# Patient Record
Sex: Male | Born: 1950
Health system: Southern US, Community
[De-identification: ages and names within clinical notes are randomized; demographics above are authoritative.]

## PROBLEM LIST (undated history)

## (undated) DIAGNOSIS — M5030 Other cervical disc degeneration, unspecified cervical region: Secondary | ICD-10-CM

## (undated) DIAGNOSIS — N529 Male erectile dysfunction, unspecified: Secondary | ICD-10-CM

## (undated) DIAGNOSIS — Z87442 Personal history of urinary calculi: Secondary | ICD-10-CM

## (undated) DIAGNOSIS — J189 Pneumonia, unspecified organism: Secondary | ICD-10-CM

## (undated) DIAGNOSIS — N401 Enlarged prostate with lower urinary tract symptoms: Secondary | ICD-10-CM

## (undated) DIAGNOSIS — M1990 Unspecified osteoarthritis, unspecified site: Secondary | ICD-10-CM

## (undated) DIAGNOSIS — G4730 Sleep apnea, unspecified: Secondary | ICD-10-CM

## (undated) DIAGNOSIS — G4733 Obstructive sleep apnea (adult) (pediatric): Secondary | ICD-10-CM

## (undated) DIAGNOSIS — K5190 Ulcerative colitis, unspecified, without complications: Secondary | ICD-10-CM

## (undated) DIAGNOSIS — R3912 Poor urinary stream: Secondary | ICD-10-CM

## (undated) DIAGNOSIS — E782 Mixed hyperlipidemia: Secondary | ICD-10-CM

## (undated) DIAGNOSIS — N189 Chronic kidney disease, unspecified: Secondary | ICD-10-CM

## (undated) DIAGNOSIS — R7301 Impaired fasting glucose: Secondary | ICD-10-CM

## (undated) DIAGNOSIS — R7303 Prediabetes: Secondary | ICD-10-CM

## (undated) DIAGNOSIS — I499 Cardiac arrhythmia, unspecified: Secondary | ICD-10-CM

## (undated) DIAGNOSIS — N486 Induration penis plastica: Secondary | ICD-10-CM

## (undated) DIAGNOSIS — M1712 Unilateral primary osteoarthritis, left knee: Secondary | ICD-10-CM

## (undated) HISTORY — DX: Unilateral primary osteoarthritis, left knee: M17.12

## (undated) HISTORY — DX: Impaired fasting glucose: R73.01

## (undated) HISTORY — DX: Obstructive sleep apnea (adult) (pediatric): G47.33

## (undated) HISTORY — PX: APPENDECTOMY: SHX54

## (undated) HISTORY — PX: OTHER SURGICAL HISTORY: SHX169

## (undated) HISTORY — DX: Sleep apnea, unspecified: G47.30

## (undated) HISTORY — PX: TONSILLECTOMY: SUR1361

## (undated) HISTORY — PX: KNEE ARTHROSCOPY: SUR90

## (undated) HISTORY — DX: Induration penis plastica: N48.6

## (undated) HISTORY — DX: Mixed hyperlipidemia: E78.2

## (undated) HISTORY — PX: ILEOSTOMY: SHX1783

---

## 1989-09-06 DIAGNOSIS — Z932 Ileostomy status: Secondary | ICD-10-CM

## 1989-09-06 HISTORY — PX: COLOPROCTECTOMY W/ ILEO J POUCH: SUR277

## 1989-09-06 HISTORY — DX: Ileostomy status: Z93.2

## 2000-09-06 HISTORY — PX: INGUINAL HERNIA REPAIR: SUR1180

## 2000-09-06 HISTORY — PX: HERNIA REPAIR: SHX51

## 2005-09-23 ENCOUNTER — Ambulatory Visit: Payer: Self-pay | Admitting: Internal Medicine

## 2005-10-22 ENCOUNTER — Ambulatory Visit: Payer: Self-pay | Admitting: Internal Medicine

## 2008-03-15 DIAGNOSIS — G4733 Obstructive sleep apnea (adult) (pediatric): Secondary | ICD-10-CM | POA: Insufficient documentation

## 2008-03-15 DIAGNOSIS — K5190 Ulcerative colitis, unspecified, without complications: Secondary | ICD-10-CM

## 2008-03-15 DIAGNOSIS — G4730 Sleep apnea, unspecified: Secondary | ICD-10-CM

## 2008-03-15 HISTORY — DX: Ulcerative colitis, unspecified, without complications: K51.90

## 2008-03-18 ENCOUNTER — Ambulatory Visit: Payer: Self-pay | Admitting: Internal Medicine

## 2008-04-18 ENCOUNTER — Encounter: Payer: Self-pay | Admitting: Internal Medicine

## 2008-09-27 HISTORY — PX: REPLACEMENT UNICONDYLAR JOINT KNEE: SUR1227

## 2010-01-13 ENCOUNTER — Ambulatory Visit: Payer: Self-pay | Admitting: Internal Medicine

## 2010-10-08 NOTE — Assessment & Plan Note (Signed)
Summary: rov/ mbw   Primary Provider/Referring Provider:  Elray Mcgregor, MD  CC:  follow up visit-CPAP machine troubles..  History of Present Illness: COLITIS, ULCERATIVE (ICD-556.9) SLEEP APNEA (ICD-780.57) 03/18/08- 60 year old man returning for follow-up of sleep apnea, and complaining of insomnia.  Blames mucus in throat for difficulty falling asleep and staying asleep.  Not using the humidifier on his CPAP.  Takes Mucinex at bedtime, and adds a Breathe Right strip.  He remains on chronic auto titration CPAP and is satisfied with this.  His ostomy appliance keeps him from sleeping on his side.  His original sleep study on 12/30/93 gave an index of 36/hr.  Now he says chronic knee pain sometimes bothers him at night.  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  We discussed sleep hygiene, realistic expectations, and long-term management of his CPAP.  Jan 13, 2010- Insomnia/ OSA Slept poorly last night saying CPAP machine stopped working. Just in last 2 days has been sleepier in daytime. He says he has been averaging  5 hours of sleep, midnight to 5AM. He expresses economic concern about various world issues. He drinks coffee, 1-2 glasses of wine at night with wife. He has changed diet, reducing carbs and asks if this explains less need for sleep. We discussed sleep hygiene.         Preventive Screening-Counseling & Management  Alcohol-Tobacco     Smoking Status: never  Current Medications (verified): 1)  Adult Aspirin Ec Low Strength 81 Mg  Tbec (Aspirin) .... Take  4  Tablet Daily. 2)  Cpap  - Auto .... At Bedtime 3)  Cvs Natural Fish Oil 1000 Mg  Caps (Omega-3 Fatty Acids) .... Take One Tablet Daily. 4)  Mucinex 600 Mg  Tb12 (Guaifenesin) .... Take 1 By Mouth At Bedtime 5)  Co Q 10 10 Mg  Caps (Coenzyme Q10) .... 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  Smoking Status:  never  Review of Systems      See HPI  The patient denies anorexia, fever, weight loss, weight gain, vision loss, decreased hearing, hoarseness, chest pain, syncope, dyspnea on exertion, peripheral edema, prolonged cough, headaches, hemoptysis, abdominal pain, and severe indigestion/heartburn.    Vital Signs:  Patient profile:   60 year old male Weight:      204 pounds O2 Sat:      95 % on Room air Pulse rate:   90 / minute BP sitting:   144 / 98  (right arm) Cuff size:   regular  Vitals Entered By: Reynaldo Minium CMA (Jan 13, 2010 3:45 PM)  O2 Flow:  Room air  Physical Exam  Additional Exam:  GENERAL:  A/Ox3; pleasant & cooperative.NAD, warm/ light diaphoresis, talkative HEENT:  [place]/AT, EOM-wnl, PERRLA, EACs-clear, TMs-wnl, NOSE-clear, THROAT-clear & wnl, Mallampati  II NECK:  Supple w/ fair ROM; no JVD; normal carotid impulses w/o bruits; no thyromegaly or nodules palpated; no lymphadenopathy. CHEST: Clear to P&A HEART:  RRR, no m/r/g  heard ABDOMEN:  EXT: Warm bilat,  no calf pain, edema, clubbing, pulses intact Skin: no rash/lesion     Impression & Recommendations:  Problem # 1:  SLEEP APNEA (ICD-780.57)  Talkative energetic person- says thyroid ok. Insomnia doesn't seem to bother him. We will provide script to replace worn out autotitration CPAP machine.  Medications Added to Medication List This Visit: 1)  Adult Aspirin Ec Low Strength 81 Mg Tbec (Aspirin) .... Take  4  tablet  daily. 2)  Cpap - Auto Advanced  .... At bedtime 3)  Replacement Cpap Machine Autotitration  .... 5-15 cwp, humidifier  Other Orders: Est. Patient Level III (16109)  Patient Instructions: 1)  Please schedule a follow-up appointment in 1 year. 2)  Script for replacement CPAP machine - continue autotitration. Prescriptions: REPLACEMENT CPAP MACHINE AUTOTITRATION 5-15 cwp, humidifier  #1 x prn   Entered and Authorized by:   Waymon Budge MD   Signed by:   Waymon Budge MD on 01/13/2010   Method used:   Print then Give to Patient   RxID:   [id number]

## 2011-09-09 DIAGNOSIS — Z96652 Presence of left artificial knee joint: Secondary | ICD-10-CM | POA: Insufficient documentation

## 2011-09-09 DIAGNOSIS — Z471 Aftercare following joint replacement surgery: Secondary | ICD-10-CM | POA: Insufficient documentation

## 2016-10-18 DIAGNOSIS — M2041 Other hammer toe(s) (acquired), right foot: Secondary | ICD-10-CM | POA: Insufficient documentation

## 2016-10-18 DIAGNOSIS — L600 Ingrowing nail: Secondary | ICD-10-CM | POA: Insufficient documentation

## 2016-10-18 DIAGNOSIS — M205X2 Other deformities of toe(s) (acquired), left foot: Secondary | ICD-10-CM | POA: Insufficient documentation

## 2017-09-06 DIAGNOSIS — N486 Induration penis plastica: Secondary | ICD-10-CM

## 2017-09-06 HISTORY — DX: Induration penis plastica: N48.6

## 2017-12-21 DIAGNOSIS — M2022 Hallux rigidus, left foot: Secondary | ICD-10-CM | POA: Insufficient documentation

## 2018-04-24 DIAGNOSIS — M4722 Other spondylosis with radiculopathy, cervical region: Secondary | ICD-10-CM | POA: Insufficient documentation

## 2018-06-07 DIAGNOSIS — M79671 Pain in right foot: Secondary | ICD-10-CM | POA: Insufficient documentation

## 2018-08-07 DIAGNOSIS — M4722 Other spondylosis with radiculopathy, cervical region: Secondary | ICD-10-CM | POA: Diagnosis not present

## 2018-08-10 DIAGNOSIS — M4722 Other spondylosis with radiculopathy, cervical region: Secondary | ICD-10-CM | POA: Diagnosis not present

## 2018-08-11 DIAGNOSIS — H2513 Age-related nuclear cataract, bilateral: Secondary | ICD-10-CM | POA: Diagnosis not present

## 2018-08-11 DIAGNOSIS — H21233 Degeneration of iris (pigmentary), bilateral: Secondary | ICD-10-CM | POA: Diagnosis not present

## 2018-09-06 HISTORY — PX: CATARACT EXTRACTION W/ INTRAOCULAR LENS IMPLANT: SHX1309

## 2018-09-13 DIAGNOSIS — H2512 Age-related nuclear cataract, left eye: Secondary | ICD-10-CM | POA: Diagnosis not present

## 2018-09-13 DIAGNOSIS — E119 Type 2 diabetes mellitus without complications: Secondary | ICD-10-CM | POA: Diagnosis not present

## 2018-09-13 DIAGNOSIS — H25812 Combined forms of age-related cataract, left eye: Secondary | ICD-10-CM | POA: Diagnosis not present

## 2018-09-13 DIAGNOSIS — Z01818 Encounter for other preprocedural examination: Secondary | ICD-10-CM | POA: Diagnosis not present

## 2018-09-13 DIAGNOSIS — H52222 Regular astigmatism, left eye: Secondary | ICD-10-CM | POA: Diagnosis not present

## 2018-10-18 DIAGNOSIS — H25811 Combined forms of age-related cataract, right eye: Secondary | ICD-10-CM | POA: Diagnosis not present

## 2018-10-18 DIAGNOSIS — H2511 Age-related nuclear cataract, right eye: Secondary | ICD-10-CM | POA: Diagnosis not present

## 2018-10-18 DIAGNOSIS — H52221 Regular astigmatism, right eye: Secondary | ICD-10-CM | POA: Diagnosis not present

## 2019-01-02 DIAGNOSIS — L237 Allergic contact dermatitis due to plants, except food: Secondary | ICD-10-CM | POA: Diagnosis not present

## 2019-01-02 DIAGNOSIS — R972 Elevated prostate specific antigen [PSA]: Secondary | ICD-10-CM | POA: Diagnosis not present

## 2019-01-02 DIAGNOSIS — N486 Induration penis plastica: Secondary | ICD-10-CM | POA: Diagnosis not present

## 2019-01-18 DIAGNOSIS — R7301 Impaired fasting glucose: Secondary | ICD-10-CM | POA: Diagnosis not present

## 2019-01-18 DIAGNOSIS — E782 Mixed hyperlipidemia: Secondary | ICD-10-CM | POA: Diagnosis not present

## 2019-01-18 DIAGNOSIS — G4733 Obstructive sleep apnea (adult) (pediatric): Secondary | ICD-10-CM | POA: Diagnosis not present

## 2019-01-18 DIAGNOSIS — Z6827 Body mass index (BMI) 27.0-27.9, adult: Secondary | ICD-10-CM | POA: Diagnosis not present

## 2019-01-19 DIAGNOSIS — E782 Mixed hyperlipidemia: Secondary | ICD-10-CM | POA: Diagnosis not present

## 2019-01-19 DIAGNOSIS — R7301 Impaired fasting glucose: Secondary | ICD-10-CM | POA: Diagnosis not present

## 2019-01-23 DIAGNOSIS — N486 Induration penis plastica: Secondary | ICD-10-CM | POA: Diagnosis not present

## 2019-01-30 DIAGNOSIS — N486 Induration penis plastica: Secondary | ICD-10-CM | POA: Diagnosis not present

## 2019-01-31 DIAGNOSIS — N486 Induration penis plastica: Secondary | ICD-10-CM | POA: Diagnosis not present

## 2019-03-13 DIAGNOSIS — N486 Induration penis plastica: Secondary | ICD-10-CM | POA: Diagnosis not present

## 2019-03-29 DIAGNOSIS — N486 Induration penis plastica: Secondary | ICD-10-CM | POA: Diagnosis not present

## 2019-04-02 DIAGNOSIS — N486 Induration penis plastica: Secondary | ICD-10-CM | POA: Diagnosis not present

## 2019-04-27 DIAGNOSIS — N486 Induration penis plastica: Secondary | ICD-10-CM | POA: Diagnosis not present

## 2019-05-22 DIAGNOSIS — N486 Induration penis plastica: Secondary | ICD-10-CM | POA: Diagnosis not present

## 2019-05-24 DIAGNOSIS — N486 Induration penis plastica: Secondary | ICD-10-CM | POA: Diagnosis not present

## 2019-06-28 DIAGNOSIS — Z01812 Encounter for preprocedural laboratory examination: Secondary | ICD-10-CM | POA: Diagnosis not present

## 2019-06-28 DIAGNOSIS — Z23 Encounter for immunization: Secondary | ICD-10-CM | POA: Diagnosis not present

## 2019-08-07 DIAGNOSIS — Z20828 Contact with and (suspected) exposure to other viral communicable diseases: Secondary | ICD-10-CM | POA: Diagnosis not present

## 2019-08-15 DIAGNOSIS — M1712 Unilateral primary osteoarthritis, left knee: Secondary | ICD-10-CM | POA: Diagnosis not present

## 2019-08-15 DIAGNOSIS — M25562 Pain in left knee: Secondary | ICD-10-CM | POA: Diagnosis not present

## 2019-08-15 DIAGNOSIS — Z20828 Contact with and (suspected) exposure to other viral communicable diseases: Secondary | ICD-10-CM | POA: Diagnosis not present

## 2019-08-15 DIAGNOSIS — Z888 Allergy status to other drugs, medicaments and biological substances status: Secondary | ICD-10-CM | POA: Diagnosis not present

## 2019-08-15 DIAGNOSIS — R519 Headache, unspecified: Secondary | ICD-10-CM | POA: Diagnosis not present

## 2019-08-15 DIAGNOSIS — Z471 Aftercare following joint replacement surgery: Secondary | ICD-10-CM | POA: Diagnosis not present

## 2019-08-15 DIAGNOSIS — M791 Myalgia, unspecified site: Secondary | ICD-10-CM | POA: Diagnosis not present

## 2019-08-15 DIAGNOSIS — Z96652 Presence of left artificial knee joint: Secondary | ICD-10-CM | POA: Diagnosis not present

## 2019-08-16 DIAGNOSIS — M1712 Unilateral primary osteoarthritis, left knee: Secondary | ICD-10-CM | POA: Diagnosis not present

## 2019-08-16 DIAGNOSIS — J069 Acute upper respiratory infection, unspecified: Secondary | ICD-10-CM | POA: Diagnosis not present

## 2019-08-16 DIAGNOSIS — N486 Induration penis plastica: Secondary | ICD-10-CM | POA: Diagnosis not present

## 2019-08-22 DIAGNOSIS — H26493 Other secondary cataract, bilateral: Secondary | ICD-10-CM | POA: Diagnosis not present

## 2019-08-24 DIAGNOSIS — N486 Induration penis plastica: Secondary | ICD-10-CM | POA: Diagnosis not present

## 2019-10-29 DIAGNOSIS — H26491 Other secondary cataract, right eye: Secondary | ICD-10-CM | POA: Diagnosis not present

## 2019-11-06 DIAGNOSIS — N486 Induration penis plastica: Secondary | ICD-10-CM | POA: Diagnosis not present

## 2019-11-06 DIAGNOSIS — R3912 Poor urinary stream: Secondary | ICD-10-CM | POA: Diagnosis not present

## 2019-11-06 DIAGNOSIS — N401 Enlarged prostate with lower urinary tract symptoms: Secondary | ICD-10-CM | POA: Diagnosis not present

## 2019-11-07 DIAGNOSIS — N486 Induration penis plastica: Secondary | ICD-10-CM | POA: Diagnosis not present

## 2019-11-14 DIAGNOSIS — H26492 Other secondary cataract, left eye: Secondary | ICD-10-CM | POA: Diagnosis not present

## 2019-12-07 ENCOUNTER — Other Ambulatory Visit: Payer: Self-pay | Admitting: Family Medicine

## 2019-12-17 DIAGNOSIS — D225 Melanocytic nevi of trunk: Secondary | ICD-10-CM | POA: Diagnosis not present

## 2019-12-17 DIAGNOSIS — L814 Other melanin hyperpigmentation: Secondary | ICD-10-CM | POA: Diagnosis not present

## 2019-12-17 DIAGNOSIS — D2361 Other benign neoplasm of skin of right upper limb, including shoulder: Secondary | ICD-10-CM | POA: Diagnosis not present

## 2019-12-17 DIAGNOSIS — L821 Other seborrheic keratosis: Secondary | ICD-10-CM | POA: Diagnosis not present

## 2019-12-25 DIAGNOSIS — E785 Hyperlipidemia, unspecified: Secondary | ICD-10-CM | POA: Insufficient documentation

## 2019-12-25 DIAGNOSIS — R7301 Impaired fasting glucose: Secondary | ICD-10-CM | POA: Insufficient documentation

## 2019-12-25 DIAGNOSIS — R030 Elevated blood-pressure reading, without diagnosis of hypertension: Secondary | ICD-10-CM | POA: Insufficient documentation

## 2019-12-25 NOTE — Progress Notes (Signed)
Established Patient Office Visit  Subjective:  Patient ID: Tyler Giles, male    DOB: 08-Jul-1951  Age: 69 y.o. MRN: [id number]  CC:  Chief Complaint  Patient presents with  . Diabetes  . Hyperlipidemia   HPI Tyler Giles presents with hyperlipidemia.  Current treatment includes Crestor.  Compliance with treatment has been good; he takes his medication as directed, maintains his low cholesterol diet, follows up as directed, and maintains his exercise regimen.      Tyler Giles presents with a diagnosis of impaired fasting glucose.  This was diagnosed years ago. Patient is eating very healthy and exercising. Patient is taking metformin.      Follow up of obstructive sleep apnea (adult) (pediatric).  Wears Cpap regularly. Patient states he sleeps better with cpap.  Ulcerative colitis in the past. Has a colostomy.     Past Medical History:  Diagnosis Date  . Impaired fasting glucose   . Induration penis plastica   . Mixed hyperlipidemia   . Obstructive sleep apnea   . Unilateral primary osteoarthritis, left knee     Past Surgical History:  Procedure Laterality Date  . APPENDECTOMY    . HERNIA REPAIR  2002   x2  . ILEOSTOMY    . Left knee arthroscopy    . Left partial Knee replacement      Family History  Problem Relation Age of Onset  . Cerebrovascular Accident Mother   . Leukemia Father   . Coronary artery disease Maternal Uncle   . Coronary artery disease Maternal Grandmother     Social History   Socioeconomic History  . Marital status: Married    Spouse name: Not on file  . Number of children: 2  . Years of education: Not on file  . Highest education level: Not on file  Occupational History  . Not on file  Tobacco Use  . Smoking status: Never Smoker  . Smokeless tobacco: Never Used  Substance and Sexual Activity  . Alcohol use: Not on file    Comment: Patient drinks socially and typically consumes wine.  . Drug use: Never  . Sexual activity: Not on  file  Other Topics Concern  . Not on file  Social History Narrative  . Not on file   Social Determinants of Health   Financial Resource Strain:   . Difficulty of Paying Living Expenses:   Food Insecurity:   . Worried About Programme researcher, broadcasting/film/video in the Last Year:   . Barista in the Last Year:   Transportation Needs:   . Freight forwarder (Medical):   Marland Kitchen Lack of Transportation (Non-Medical):   Physical Activity:   . Days of Exercise per Week:   . Minutes of Exercise per Session:   Stress:   . Feeling of Stress :   Social Connections:   . Frequency of Communication with Friends and Family:   . Frequency of Social Gatherings with Friends and Family:   . Attends Religious Services:   . Active Member of Clubs or Organizations:   . Attends Banker Meetings:   Marland Kitchen Marital Status:   Intimate Partner Violence:   . Fear of Current or Ex-Partner:   . Emotionally Abused:   Marland Kitchen Physically Abused:   . Sexually Abused:     Outpatient Medications Prior to Visit  Medication Sig Dispense Refill  . aspirin 81 MG EC tablet Take by mouth.    . Cholecalciferol 100 MCG (4000 UT) CAPS Take  by mouth.    . Coenzyme Q10 (COQ10) 150 MG CAPS Take by mouth.    . finasteride (PROSCAR) 5 MG tablet TAKE 1 TABLET BY MOUTH ONCE DAILY 90 tablet 1  . metFORMIN (GLUCOPHAGE) 500 MG tablet Take by mouth.    . Multiple Vitamin (MULTIVITAMIN) capsule Take by mouth.    . rosuvastatin (CRESTOR) 5 MG tablet TAKE 1 TABLET BY MOUTH ONCE DAILY 90 tablet 1  . tamsulosin (FLOMAX) 0.4 MG CAPS capsule Take 0.4 mg by mouth daily.    Marland Kitchen zinc gluconate 50 MG tablet Take by mouth.     No facility-administered medications prior to visit.    Allergies  Allergen Reactions  . Atorvastatin Other (See Comments)    Muscle cramps    ROS Review of Systems  Constitutional: Negative for chills, fatigue and fever.  HENT: Negative for congestion, ear pain and sore throat.   Respiratory: Negative for cough  and shortness of breath.   Cardiovascular: Negative for chest pain and leg swelling.  Gastrointestinal: Negative for abdominal pain, constipation, diarrhea, nausea and vomiting.  Genitourinary: Negative for dysuria and frequency.  Musculoskeletal: Positive for neck pain. Negative for arthralgias, back pain and myalgias.       Monitoring. Occasionally aleve.   Neurological: Negative for dizziness and headaches.  Psychiatric/Behavioral: Negative for dysphoric mood. The patient is not nervous/anxious.       Objective:    Physical Exam  Constitutional: He is oriented to person, place, and time. He appears well-developed and well-nourished.  Cardiovascular: Normal rate, regular rhythm and normal heart sounds.  Pulmonary/Chest: Effort normal and breath sounds normal. No respiratory distress.  Abdominal: Soft. Bowel sounds are normal. There is no abdominal tenderness.  Musculoskeletal:        General: No edema.  Neurological: He is alert and oriented to person, place, and time.  Psychiatric: He has a normal mood and affect. His behavior is normal.   BP (!) 134/92   Pulse 74   Temp (!) 97.4 F (36.3 C)   Ht 5\' 9"  (1.753 m)   Wt 193 lb (87.5 kg)   SpO2 100%   BMI 28.50 kg/m  Wt Readings from Last 3 Encounters:  12/26/19 193 lb (87.5 kg)   Health Maintenance Due  Topic Date Due  . Hepatitis C Screening  Never done  . COVID-19 Vaccine (1) Never done  . COLONOSCOPY  Never done     Assessment & Plan:  1. Mixed hyperlipidemia Well controlled.  No changes to medicines.  Continue to work on eating a healthy diet and exercise.  Labs drawn today.  - CBC - Comprehensive metabolic panel - Lipid panel  2. Impaired fasting glucose Well controlled.  No changes to medicines.  Continue to work on eating a healthy diet and exercise.  Labs drawn today.  - Hemoglobin A1c  3. Colostomy status - no problems  4. Cervical DDD with radiculopathy. Managing now. Monitor. Sees Teodora Medici,  NP.   5. Patient would like to get the bcg vaccine because he has researched it and some research shows it may protect from covid 19. We will work on this.   Follow-up: Return in about 6 months (around 06/26/2020) for AWV.    Rochel Brome, MD

## 2019-12-26 ENCOUNTER — Other Ambulatory Visit: Payer: Self-pay

## 2019-12-26 ENCOUNTER — Encounter: Payer: Self-pay | Admitting: Family Medicine

## 2019-12-26 ENCOUNTER — Ambulatory Visit (INDEPENDENT_AMBULATORY_CARE_PROVIDER_SITE_OTHER): Payer: BC Managed Care – PPO | Admitting: Family Medicine

## 2019-12-26 VITALS — BP 134/92 | HR 74 | Temp 97.4°F | Ht 69.0 in | Wt 193.0 lb

## 2019-12-26 DIAGNOSIS — E782 Mixed hyperlipidemia: Secondary | ICD-10-CM

## 2019-12-26 DIAGNOSIS — R7301 Impaired fasting glucose: Secondary | ICD-10-CM

## 2019-12-26 DIAGNOSIS — Z933 Colostomy status: Secondary | ICD-10-CM | POA: Diagnosis not present

## 2019-12-26 DIAGNOSIS — M4722 Other spondylosis with radiculopathy, cervical region: Secondary | ICD-10-CM | POA: Diagnosis not present

## 2019-12-26 MED ORDER — METFORMIN HCL ER 500 MG PO TB24: 500.0000 mg | ORAL_TABLET | Freq: Two times a day (BID) | ORAL | 3 refills | Status: DC

## 2019-12-27 LAB — CBC
Hematocrit: 44.2 % (ref 37.5–51.0)
Hemoglobin: 15.2 g/dL (ref 13.0–17.7)
MCH: 31.7 pg (ref 26.6–33.0)
MCHC: 34.4 g/dL (ref 31.5–35.7)
MCV: 92 fL (ref 79–97)
Platelets: 191 10*3/uL (ref 150–450)
RBC: 4.79 x10E6/uL (ref 4.14–5.80)
RDW: 13.1 % (ref 11.6–15.4)
WBC: 4.4 10*3/uL (ref 3.4–10.8)

## 2019-12-27 LAB — LIPID PANEL
Chol/HDL Ratio: 2.7 ratio (ref 0.0–5.0)
Cholesterol, Total: 159 mg/dL (ref 100–199)
HDL: 58 mg/dL (ref >39)
LDL Chol Calc (NIH): 84 mg/dL (ref 0–99)
Triglycerides: 92 mg/dL (ref 0–149)
VLDL Cholesterol Cal: 17 mg/dL (ref 5–40)

## 2019-12-27 LAB — COMPREHENSIVE METABOLIC PANEL
ALT: 15 IU/L (ref 0–44)
AST: 22 IU/L (ref 0–40)
Albumin/Globulin Ratio: 2.6 — ABNORMAL HIGH (ref 1.2–2.2)
Albumin: 4.6 g/dL (ref 3.8–4.8)
Alkaline Phosphatase: 72 IU/L (ref 39–117)
BUN/Creatinine Ratio: 21 (ref 10–24)
BUN: 20 mg/dL (ref 8–27)
Bilirubin Total: 0.5 mg/dL (ref 0.0–1.2)
CO2: 25 mmol/L (ref 20–29)
Calcium: 9.8 mg/dL (ref 8.6–10.2)
Chloride: 103 mmol/L (ref 96–106)
Creatinine, Ser: 0.95 mg/dL (ref 0.76–1.27)
GFR calc Af Amer: 95 mL/min/{1.73_m2} (ref >59)
GFR calc non Af Amer: 82 mL/min/{1.73_m2} (ref >59)
Globulin, Total: 1.8 g/dL (ref 1.5–4.5)
Glucose: 95 mg/dL (ref 65–99)
Potassium: 5.1 mmol/L (ref 3.5–5.2)
Sodium: 141 mmol/L (ref 134–144)
Total Protein: 6.4 g/dL (ref 6.0–8.5)

## 2019-12-27 LAB — HEMOGLOBIN A1C
Est. average glucose Bld gHb Est-mCnc: 108 mg/dL
Hgb A1c MFr Bld: 5.4 % (ref 4.8–5.6)

## 2019-12-27 LAB — CARDIOVASCULAR RISK ASSESSMENT

## 2020-01-06 ENCOUNTER — Encounter: Payer: Self-pay | Admitting: Family Medicine

## 2020-01-18 ENCOUNTER — Other Ambulatory Visit: Payer: Self-pay

## 2020-02-19 ENCOUNTER — Ambulatory Visit: Payer: BC Managed Care – PPO

## 2020-02-19 ENCOUNTER — Ambulatory Visit (INDEPENDENT_AMBULATORY_CARE_PROVIDER_SITE_OTHER): Payer: BC Managed Care – PPO

## 2020-02-19 ENCOUNTER — Other Ambulatory Visit: Payer: Self-pay

## 2020-02-19 DIAGNOSIS — Z111 Encounter for screening for respiratory tuberculosis: Secondary | ICD-10-CM | POA: Diagnosis not present

## 2020-02-21 ENCOUNTER — Ambulatory Visit: Payer: BC Managed Care – PPO

## 2020-02-21 ENCOUNTER — Other Ambulatory Visit: Payer: Self-pay

## 2020-02-21 DIAGNOSIS — Z111 Encounter for screening for respiratory tuberculosis: Secondary | ICD-10-CM

## 2020-02-21 DIAGNOSIS — M25561 Pain in right knee: Secondary | ICD-10-CM | POA: Diagnosis not present

## 2020-02-21 DIAGNOSIS — Z23 Encounter for immunization: Secondary | ICD-10-CM

## 2020-02-21 DIAGNOSIS — M25562 Pain in left knee: Secondary | ICD-10-CM | POA: Diagnosis not present

## 2020-02-21 NOTE — Progress Notes (Signed)
Patient came in on 02/19/2019 for PPD placement.

## 2020-02-22 LAB — TB SKIN TEST
Induration: 0 mm
TB Skin Test: NEGATIVE

## 2020-02-22 NOTE — Addendum Note (Signed)
Addended by: Jacklynn Bue on: 02/22/2020 10:42 AM   Modules accepted: Orders

## 2020-02-22 NOTE — Progress Notes (Signed)
Patient came in for PPD reading on 02/21/2020. 

## 2020-02-22 NOTE — Progress Notes (Signed)
      Mr. Gundrum requested the BCG Vaccine.  He has read the information including potential side effects and signed the consent form.  He denies having any questions or concerns.    Vaccine was constituted using sterile water and administered on the left deltoid using a multiple puncture device.  Patient tolerated well.  Creola Corn, LPN 21/11/73 56:70 AM

## 2020-02-25 DIAGNOSIS — M25562 Pain in left knee: Secondary | ICD-10-CM | POA: Diagnosis not present

## 2020-02-25 DIAGNOSIS — M25561 Pain in right knee: Secondary | ICD-10-CM | POA: Diagnosis not present

## 2020-02-28 DIAGNOSIS — M25462 Effusion, left knee: Secondary | ICD-10-CM | POA: Diagnosis not present

## 2020-02-28 DIAGNOSIS — M25461 Effusion, right knee: Secondary | ICD-10-CM | POA: Diagnosis not present

## 2020-02-28 DIAGNOSIS — T84033A Mechanical loosening of internal left knee prosthetic joint, initial encounter: Secondary | ICD-10-CM | POA: Insufficient documentation

## 2020-02-28 DIAGNOSIS — M11262 Other chondrocalcinosis, left knee: Secondary | ICD-10-CM | POA: Diagnosis not present

## 2020-02-28 DIAGNOSIS — M1712 Unilateral primary osteoarthritis, left knee: Secondary | ICD-10-CM | POA: Diagnosis not present

## 2020-02-28 DIAGNOSIS — M25561 Pain in right knee: Secondary | ICD-10-CM | POA: Diagnosis not present

## 2020-02-28 DIAGNOSIS — M25562 Pain in left knee: Secondary | ICD-10-CM | POA: Diagnosis not present

## 2020-02-28 DIAGNOSIS — T84033D Mechanical loosening of internal left knee prosthetic joint, subsequent encounter: Secondary | ICD-10-CM | POA: Diagnosis not present

## 2020-02-28 DIAGNOSIS — M11261 Other chondrocalcinosis, right knee: Secondary | ICD-10-CM | POA: Diagnosis not present

## 2020-02-28 DIAGNOSIS — M1711 Unilateral primary osteoarthritis, right knee: Secondary | ICD-10-CM | POA: Diagnosis not present

## 2020-03-03 DIAGNOSIS — M25562 Pain in left knee: Secondary | ICD-10-CM | POA: Diagnosis not present

## 2020-03-03 DIAGNOSIS — M25561 Pain in right knee: Secondary | ICD-10-CM | POA: Diagnosis not present

## 2020-03-05 DIAGNOSIS — M25562 Pain in left knee: Secondary | ICD-10-CM | POA: Diagnosis not present

## 2020-03-05 DIAGNOSIS — M25561 Pain in right knee: Secondary | ICD-10-CM | POA: Diagnosis not present

## 2020-03-11 DIAGNOSIS — M25562 Pain in left knee: Secondary | ICD-10-CM | POA: Diagnosis not present

## 2020-03-11 DIAGNOSIS — M25561 Pain in right knee: Secondary | ICD-10-CM | POA: Diagnosis not present

## 2020-03-14 DIAGNOSIS — M25561 Pain in right knee: Secondary | ICD-10-CM | POA: Diagnosis not present

## 2020-03-14 DIAGNOSIS — M25562 Pain in left knee: Secondary | ICD-10-CM | POA: Diagnosis not present

## 2020-03-17 DIAGNOSIS — M25561 Pain in right knee: Secondary | ICD-10-CM | POA: Diagnosis not present

## 2020-03-17 DIAGNOSIS — M25562 Pain in left knee: Secondary | ICD-10-CM | POA: Diagnosis not present

## 2020-03-21 DIAGNOSIS — M25562 Pain in left knee: Secondary | ICD-10-CM | POA: Diagnosis not present

## 2020-03-21 DIAGNOSIS — M25561 Pain in right knee: Secondary | ICD-10-CM | POA: Diagnosis not present

## 2020-03-25 DIAGNOSIS — M25561 Pain in right knee: Secondary | ICD-10-CM | POA: Diagnosis not present

## 2020-03-25 DIAGNOSIS — M25562 Pain in left knee: Secondary | ICD-10-CM | POA: Diagnosis not present

## 2020-03-31 DIAGNOSIS — M25562 Pain in left knee: Secondary | ICD-10-CM | POA: Diagnosis not present

## 2020-03-31 DIAGNOSIS — M25561 Pain in right knee: Secondary | ICD-10-CM | POA: Diagnosis not present

## 2020-04-03 DIAGNOSIS — M25561 Pain in right knee: Secondary | ICD-10-CM | POA: Diagnosis not present

## 2020-04-03 DIAGNOSIS — M25562 Pain in left knee: Secondary | ICD-10-CM | POA: Diagnosis not present

## 2020-04-10 DIAGNOSIS — M25561 Pain in right knee: Secondary | ICD-10-CM | POA: Diagnosis not present

## 2020-04-10 DIAGNOSIS — M25562 Pain in left knee: Secondary | ICD-10-CM | POA: Diagnosis not present

## 2020-04-14 DIAGNOSIS — M25561 Pain in right knee: Secondary | ICD-10-CM | POA: Diagnosis not present

## 2020-04-14 DIAGNOSIS — M25562 Pain in left knee: Secondary | ICD-10-CM | POA: Diagnosis not present

## 2020-04-17 DIAGNOSIS — M25562 Pain in left knee: Secondary | ICD-10-CM | POA: Diagnosis not present

## 2020-04-17 DIAGNOSIS — M25561 Pain in right knee: Secondary | ICD-10-CM | POA: Diagnosis not present

## 2020-04-22 DIAGNOSIS — B974 Respiratory syncytial virus as the cause of diseases classified elsewhere: Secondary | ICD-10-CM | POA: Diagnosis not present

## 2020-04-22 DIAGNOSIS — U071 COVID-19: Secondary | ICD-10-CM | POA: Diagnosis not present

## 2020-04-22 DIAGNOSIS — J101 Influenza due to other identified influenza virus with other respiratory manifestations: Secondary | ICD-10-CM | POA: Diagnosis not present

## 2020-04-22 DIAGNOSIS — Z20828 Contact with and (suspected) exposure to other viral communicable diseases: Secondary | ICD-10-CM | POA: Diagnosis not present

## 2020-04-23 DIAGNOSIS — M25562 Pain in left knee: Secondary | ICD-10-CM | POA: Diagnosis not present

## 2020-04-23 DIAGNOSIS — M25561 Pain in right knee: Secondary | ICD-10-CM | POA: Diagnosis not present

## 2020-04-29 DIAGNOSIS — M25561 Pain in right knee: Secondary | ICD-10-CM | POA: Diagnosis not present

## 2020-04-29 DIAGNOSIS — M25562 Pain in left knee: Secondary | ICD-10-CM | POA: Diagnosis not present

## 2020-05-01 DIAGNOSIS — Z20828 Contact with and (suspected) exposure to other viral communicable diseases: Secondary | ICD-10-CM | POA: Diagnosis not present

## 2020-05-01 DIAGNOSIS — U071 COVID-19: Secondary | ICD-10-CM | POA: Diagnosis not present

## 2020-05-01 DIAGNOSIS — J101 Influenza due to other identified influenza virus with other respiratory manifestations: Secondary | ICD-10-CM | POA: Diagnosis not present

## 2020-05-01 DIAGNOSIS — B974 Respiratory syncytial virus as the cause of diseases classified elsewhere: Secondary | ICD-10-CM | POA: Diagnosis not present

## 2020-05-05 DIAGNOSIS — M25562 Pain in left knee: Secondary | ICD-10-CM | POA: Diagnosis not present

## 2020-05-05 DIAGNOSIS — M25561 Pain in right knee: Secondary | ICD-10-CM | POA: Diagnosis not present

## 2020-05-08 DIAGNOSIS — T84033A Mechanical loosening of internal left knee prosthetic joint, initial encounter: Secondary | ICD-10-CM | POA: Diagnosis not present

## 2020-05-08 DIAGNOSIS — X58XXXA Exposure to other specified factors, initial encounter: Secondary | ICD-10-CM | POA: Diagnosis not present

## 2020-05-08 DIAGNOSIS — Z932 Ileostomy status: Secondary | ICD-10-CM | POA: Insufficient documentation

## 2020-05-08 DIAGNOSIS — Z96652 Presence of left artificial knee joint: Secondary | ICD-10-CM | POA: Diagnosis not present

## 2020-05-08 DIAGNOSIS — E785 Hyperlipidemia, unspecified: Secondary | ICD-10-CM | POA: Diagnosis not present

## 2020-05-08 DIAGNOSIS — Z8719 Personal history of other diseases of the digestive system: Secondary | ICD-10-CM | POA: Diagnosis not present

## 2020-05-08 DIAGNOSIS — G4730 Sleep apnea, unspecified: Secondary | ICD-10-CM | POA: Diagnosis not present

## 2020-05-08 DIAGNOSIS — N400 Enlarged prostate without lower urinary tract symptoms: Secondary | ICD-10-CM | POA: Diagnosis not present

## 2020-05-09 DIAGNOSIS — M25561 Pain in right knee: Secondary | ICD-10-CM | POA: Diagnosis not present

## 2020-05-09 DIAGNOSIS — M25562 Pain in left knee: Secondary | ICD-10-CM | POA: Diagnosis not present

## 2020-05-15 DIAGNOSIS — M25562 Pain in left knee: Secondary | ICD-10-CM | POA: Diagnosis not present

## 2020-05-15 DIAGNOSIS — M25561 Pain in right knee: Secondary | ICD-10-CM | POA: Diagnosis not present

## 2020-05-28 ENCOUNTER — Other Ambulatory Visit: Payer: Self-pay | Admitting: Family Medicine

## 2020-06-26 ENCOUNTER — Encounter: Payer: Self-pay | Admitting: Family Medicine

## 2020-06-26 ENCOUNTER — Other Ambulatory Visit: Payer: Self-pay

## 2020-06-26 ENCOUNTER — Ambulatory Visit (INDEPENDENT_AMBULATORY_CARE_PROVIDER_SITE_OTHER): Payer: BC Managed Care – PPO | Admitting: Family Medicine

## 2020-06-26 VITALS — BP 116/78 | HR 80 | Temp 96.5°F | Resp 16 | Ht 69.0 in | Wt 193.0 lb

## 2020-06-26 DIAGNOSIS — N400 Enlarged prostate without lower urinary tract symptoms: Secondary | ICD-10-CM

## 2020-06-26 DIAGNOSIS — Z932 Ileostomy status: Secondary | ICD-10-CM

## 2020-06-26 DIAGNOSIS — R7301 Impaired fasting glucose: Secondary | ICD-10-CM

## 2020-06-26 DIAGNOSIS — E782 Mixed hyperlipidemia: Secondary | ICD-10-CM | POA: Diagnosis not present

## 2020-06-26 DIAGNOSIS — Z23 Encounter for immunization: Secondary | ICD-10-CM | POA: Diagnosis not present

## 2020-06-26 DIAGNOSIS — Z0000 Encounter for general adult medical examination without abnormal findings: Secondary | ICD-10-CM

## 2020-06-26 NOTE — Patient Instructions (Signed)

## 2020-06-26 NOTE — Progress Notes (Signed)
Subjective:  Patient ID: Florencia Reasons, male    DOB: [birth year]  Age: 69 y.o. MRN: [id number]  Chief Complaint  Patient presents with  . Annual Exam  . Hyperlipidemia  . Prediabetes    HPI Encounter for general adult medical examination without abnormal findings  Physical ("At Risk" items are starred): Patient's last physical exam was 1 year ago .  Safety: reviewed ;  Patient wears a seat belt. Patient's home has smoke detectors and carbon monoxide detectors. Patient does not have any guns.  Patient wears sunscreen with extended sun exposure. Dental Care: biannual cleanings, brushes and flosses daily. Ophthalmology/Optometry: Annual visit.  Hearing loss: none Vision impairments: none Patient is not afflicted from Stress Incontinence and Urge Incontinence  Supplements: Cholecalciferol 100 mcg daily, coenzyme Q 10 150 mg daily, and zinc 50 mg daily.  Patient has a history of ulcerative colitis.  He has had a full colectomy and has an ileostomy.  He has no issues with his colostomy.  Impaired fasting glucose: Patient takes Metformin 500 mg twice daily.  He eats healthy.  Hyperlipidemia: Patient takes Crestor 5 mg once daily at night, coenzyme Q 10 150 mg daily, and aspirin 81 mg daily.  BPH: Patient takes Proscar 5 mg once daily.  He sees urology.  Patient has osteoarthritis of his left knee for which he underwent a partial knee replacement.  However he is going to have a left total knee replacement in January 2022. Growth percentile SmartLinks can only be used for patients less than 59 years old.   SDOH Screenings   Alcohol Screen: Low Risk   . Last Alcohol Screening Score (AUDIT): 4  Depression (PHQ2-9): Low Risk   . PHQ-2 Score: 0  Financial Resource Strain:   . Difficulty of Paying Living Expenses: Not on file  Food Insecurity:   . Worried About Programme researcher, broadcasting/film/video in the Last Year: Not on file  . Ran Out of Food in the Last Year: Not on file  Housing:   . Last  Housing Risk Score: Not on file  Physical Activity: Insufficiently Active  . Days of Exercise per Week: 3 days  . Minutes of Exercise per Session: 20 min  Social Connections:   . Frequency of Communication with Friends and Family: Not on file  . Frequency of Social Gatherings with Friends and Family: Not on file  . Attends Religious Services: Not on file  . Active Member of Clubs or Organizations: Not on file  . Attends Banker Meetings: Not on file  . Marital Status: Not on file  Stress:   . Feeling of Stress : Not on file  Tobacco Use: Low Risk   . Smoking Tobacco Use: Never Smoker  . Smokeless Tobacco Use: Never Used  Transportation Needs:   . Freight forwarder (Medical): Not on file  . Lack of Transportation (Non-Medical): Not on file    Fall Risk  06/29/2020  Falls in the past year? 0  Number falls in past yr: 0  Injury with Fall? 0  Risk for fall due to : No Fall Risks  Follow up Falls evaluation completed;Falls prevention discussed    Depression screen [facility] 2/9 06/29/2020 12/26/2019  Decreased Interest 0 0  Down, Depressed, Hopeless 0 0  PHQ - 2 Score 0 0    Functional Status Survey: Is the patient deaf or have difficulty hearing?: No Does the patient have difficulty seeing, even when wearing glasses/contacts?: No Does the patient have difficulty  concentrating, remembering, or making decisions?: No Does the patient have difficulty walking or climbing stairs?: No Does the patient have difficulty dressing or bathing?: No Does the patient have difficulty doing errands alone such as visiting a doctor's office or shopping?: No     Current Outpatient Medications on File Prior to Visit  Medication Sig Dispense Refill  . aspirin 81 MG EC tablet Take by mouth.    . Cholecalciferol 100 MCG (4000 UT) CAPS Take by mouth.    . Coenzyme Q10 (COQ10) 150 MG CAPS Take by mouth.    . finasteride (PROSCAR) 5 MG tablet TAKE 1 TABLET BY MOUTH ONCE DAILY 90 tablet 1    . metFORMIN (GLUCOPHAGE-XR) 500 MG 24 hr tablet Take 1 tablet (500 mg total) by mouth 2 (two) times daily. 180 tablet 3  . Multiple Vitamin (MULTIVITAMIN) capsule Take by mouth.    . naproxen sodium (ALEVE) 220 MG tablet Take by mouth.    . Omega-3 Fatty Acids (FISH OIL) 1000 MG CAPS Take 1 capsule by mouth daily.    . rosuvastatin (CRESTOR) 5 MG tablet TAKE 1 TABLET BY MOUTH ONCE DAILY 90 tablet 1  . zinc gluconate 50 MG tablet Take by mouth.     No current facility-administered medications on file prior to visit.    Social Hx   Social History   Socioeconomic History  . Marital status: Married    Spouse name: Not on file  . Number of children: 2  . Years of education: Not on file  . Highest education level: Not on file  Occupational History  . Not on file  Tobacco Use  . Smoking status: Never Smoker  . Smokeless tobacco: Never Used  Vaping Use  . Vaping Use: Never used  Substance and Sexual Activity  . Alcohol use: Yes    Alcohol/week: 5.0 standard drinks    Types: 5 Glasses of wine per week    Comment: Patient drinks socially and typically consumes wine.  . Drug use: Never  . Sexual activity: Yes  Other Topics Concern  . Not on file  Social History Narrative  . Not on file   Social Determinants of Health   Financial Resource Strain:   . Difficulty of Paying Living Expenses: Not on file  Food Insecurity:   . Worried About Programme researcher, broadcasting/film/video in the Last Year: Not on file  . Ran Out of Food in the Last Year: Not on file  Transportation Needs:   . Lack of Transportation (Medical): Not on file  . Lack of Transportation (Non-Medical): Not on file  Physical Activity: Insufficiently Active  . Days of Exercise per Week: 3 days  . Minutes of Exercise per Session: 20 min  Stress:   . Feeling of Stress : Not on file  Social Connections:   . Frequency of Communication with Friends and Family: Not on file  . Frequency of Social Gatherings with Friends and Family: Not on  file  . Attends Religious Services: Not on file  . Active Member of Clubs or Organizations: Not on file  . Attends Banker Meetings: Not on file  . Marital Status: Not on file   Past Medical History:  Diagnosis Date  . Impaired fasting glucose   . Induration penis plastica   . Mixed hyperlipidemia   . Obstructive sleep apnea   . Unilateral primary osteoarthritis, left knee    Family History  Problem Relation Age of Onset  . Cerebrovascular Accident Mother   .  Acute myelogenous leukemia Father   . Prostate cancer Father   . Coronary artery disease Maternal Uncle   . Coronary artery disease Maternal Grandmother   . Stroke Paternal Grandmother   . Stroke Paternal Grandfather     Review of Systems  Constitutional: Negative for chills, fatigue and fever.  HENT: Negative for congestion, ear pain and sore throat.   Respiratory: Negative for cough and shortness of breath.   Cardiovascular: Negative for chest pain.  Gastrointestinal: Negative for abdominal pain, constipation, diarrhea, nausea and vomiting.  Endocrine: Negative for polydipsia, polyphagia and polyuria.  Genitourinary: Negative for dysuria and frequency.  Musculoskeletal: Positive for arthralgias. Negative for myalgias.  Neurological: Negative for dizziness and headaches.  Psychiatric/Behavioral: Negative for dysphoric mood.       No dysphoria     Objective:  BP 116/78   Pulse 80   Temp (!) 96.5 F (35.8 C)   Resp 16   Ht 5\' 9"  (1.753 m)   Wt 193 lb (87.5 kg)   BMI 28.50 kg/m   BP/Weight 06/26/2020 12/26/2019 01/13/2010  Systolic BP 116 134 144  Diastolic BP 78 92 98  Wt. (Lbs) 193 193 204  BMI 28.5 28.5 -    Physical Exam Vitals reviewed.  Constitutional:      Appearance: Normal appearance. He is normal weight.  HENT:     Right Ear: Tympanic membrane, ear canal and external ear normal.     Left Ear: Tympanic membrane, ear canal and external ear normal.     Nose: Nose normal. No  congestion or rhinorrhea.     Mouth/Throat:     Mouth: Mucous membranes are moist.     Pharynx: No oropharyngeal exudate or posterior oropharyngeal erythema.  Neck:     Vascular: No carotid bruit.  Cardiovascular:     Rate and Rhythm: Normal rate and regular rhythm.     Pulses: Normal pulses.     Heart sounds: Normal heart sounds.  Pulmonary:     Effort: Pulmonary effort is normal. No respiratory distress.     Breath sounds: Normal breath sounds. No wheezing, rhonchi or rales.  Abdominal:     General: Bowel sounds are normal.     Palpations: Abdomen is soft.     Tenderness: There is no abdominal tenderness.  Musculoskeletal:        General: Deformity (Crossover toes second digit over first digit more on the left foot than the right foot.) present.  Lymphadenopathy:     Cervical: No cervical adenopathy.  Neurological:     Mental Status: He is alert.  Psychiatric:        Mood and Affect: Mood normal.        Behavior: Behavior normal.     Lab Results  Component Value Date   WBC 4.9 06/26/2020   HGB 15.6 06/26/2020   HCT 45.9 06/26/2020   PLT 192 06/26/2020   GLUCOSE 95 06/26/2020   CHOL 162 06/26/2020   TRIG 75 06/26/2020   HDL 54 06/26/2020   LDLCALC 94 06/26/2020   ALT 15 06/26/2020   AST 22 06/26/2020   NA 139 06/26/2020   K 4.7 06/26/2020   CL 102 06/26/2020   CREATININE 1.01 06/26/2020   BUN 17 06/26/2020   CO2 22 06/26/2020   TSH 1.250 06/26/2020   HGBA1C 5.6 06/26/2020      Assessment & Plan:  1. Mixed hyperlipidemia Well controlled.  No changes to medicines.  Continue to work on eating a healthy diet and  exercise.  Labs drawn today.  - CBC with Differential/Platelet - Comprehensive metabolic panel - Lipid panel - TSH  2. Impaired fasting glucose The current medical regimen is effective;  continue present plan and medications. - Hemoglobin A1c  3. Benign prostatic hyperplasia without lower urinary tract symptoms The current medical regimen is  effective;  continue present plan and medications. - PSA  4. General medical exam Patient is a very healthy 69 year old gentleman with chronic medical issues that are very well controlled. Would recommend continue to eat healthy and exercise as much as he can. Patient is fully vaccinated. Patient has a negative depression screen. Patient is also negative for any falls. Patient has advanced directives in place.  I did offer to have him bring those here and we can upload them into our EMR (vynca) We discussed his total knee replacement coming up  5. Need for immunization against influenza - Flu Vaccine QUAD High Dose(Fluad)   6.  Ileostomy Patient takes excellent care numbers ileostomy.  He has no issues.  This is a list of the screening recommended for you and due dates:  Health Maintenance  Topic Date Due  .  Hepatitis C: One time screening is recommended by Center for Disease Control  (CDC) for  adults born from [birth year] through 1965.   Never done  . Tetanus Vaccine  07/05/2028  . Flu Shot  Completed  . COVID-19 Vaccine  Completed  . Pneumonia vaccines  Completed  . Colon Cancer Screening  Discontinued    AN INDIVIDUALIZED CARE PLAN: was established or reinforced today.   SELF MANAGEMENT: The patient and I together assessed ways to personally work towards obtaining the recommended goals  Support needs The patient and/or family needs were assessed and services were offered and not necessary at this time.    Follow-up: Return in about 6 months (around 12/25/2020) for FASTING. Blane Ohara [facility] [phone]

## 2020-06-27 LAB — HEMOGLOBIN A1C
Est. average glucose Bld gHb Est-mCnc: 114 mg/dL
Hgb A1c MFr Bld: 5.6 % (ref 4.8–5.6)

## 2020-06-27 LAB — COMPREHENSIVE METABOLIC PANEL
ALT: 15 IU/L (ref 0–44)
AST: 22 IU/L (ref 0–40)
Albumin/Globulin Ratio: 2.2 (ref 1.2–2.2)
Albumin: 4.4 g/dL (ref 3.8–4.8)
Alkaline Phosphatase: 75 IU/L (ref 44–121)
BUN/Creatinine Ratio: 17 (ref 10–24)
BUN: 17 mg/dL (ref 8–27)
Bilirubin Total: 0.5 mg/dL (ref 0.0–1.2)
CO2: 22 mmol/L (ref 20–29)
Calcium: 9.6 mg/dL (ref 8.6–10.2)
Chloride: 102 mmol/L (ref 96–106)
Creatinine, Ser: 1.01 mg/dL (ref 0.76–1.27)
GFR calc Af Amer: 87 mL/min/{1.73_m2} (ref >59)
GFR calc non Af Amer: 76 mL/min/{1.73_m2} (ref >59)
Globulin, Total: 2 g/dL (ref 1.5–4.5)
Glucose: 95 mg/dL (ref 65–99)
Potassium: 4.7 mmol/L (ref 3.5–5.2)
Sodium: 139 mmol/L (ref 134–144)
Total Protein: 6.4 g/dL (ref 6.0–8.5)

## 2020-06-27 LAB — CARDIOVASCULAR RISK ASSESSMENT

## 2020-06-27 LAB — CBC WITH DIFFERENTIAL/PLATELET
Basophils Absolute: 0 10*3/uL (ref 0.0–0.2)
Basos: 1 %
EOS (ABSOLUTE): 0.1 10*3/uL (ref 0.0–0.4)
Eos: 2 %
Hematocrit: 45.9 % (ref 37.5–51.0)
Hemoglobin: 15.6 g/dL (ref 13.0–17.7)
Immature Grans (Abs): 0 10*3/uL (ref 0.0–0.1)
Immature Granulocytes: 0 %
Lymphocytes Absolute: 0.8 10*3/uL (ref 0.7–3.1)
Lymphs: 16 %
MCH: 31.3 pg (ref 26.6–33.0)
MCHC: 34 g/dL (ref 31.5–35.7)
MCV: 92 fL (ref 79–97)
Monocytes Absolute: 0.4 10*3/uL (ref 0.1–0.9)
Monocytes: 7 %
Neutrophils Absolute: 3.6 10*3/uL (ref 1.4–7.0)
Neutrophils: 74 %
Platelets: 192 10*3/uL (ref 150–450)
RBC: 4.98 x10E6/uL (ref 4.14–5.80)
RDW: 13.7 % (ref 11.6–15.4)
WBC: 4.9 10*3/uL (ref 3.4–10.8)

## 2020-06-27 LAB — LIPID PANEL
Chol/HDL Ratio: 3 ratio (ref 0.0–5.0)
Cholesterol, Total: 162 mg/dL (ref 100–199)
HDL: 54 mg/dL (ref >39)
LDL Chol Calc (NIH): 94 mg/dL (ref 0–99)
Triglycerides: 75 mg/dL (ref 0–149)
VLDL Cholesterol Cal: 14 mg/dL (ref 5–40)

## 2020-06-27 LAB — TSH: TSH: 1.25 u[IU]/mL (ref 0.450–4.500)

## 2020-06-27 LAB — PSA: Prostate Specific Ag, Serum: 1.1 ng/mL (ref 0.0–4.0)

## 2020-06-29 ENCOUNTER — Encounter: Payer: Self-pay | Admitting: Family Medicine

## 2020-06-29 NOTE — Progress Notes (Signed)
Patient is having issues with his knee.  He is scheduled with Dr. Molly Maduro for total knee replacement on September 09, 2020

## 2020-09-06 HISTORY — PX: TOTAL KNEE ARTHROPLASTY: SHX125

## 2020-09-09 HISTORY — PX: REVISION TOTAL KNEE ARTHROPLASTY: SHX767

## 2020-11-19 ENCOUNTER — Telehealth: Payer: Self-pay

## 2020-11-19 NOTE — Telephone Encounter (Signed)
Dr. Cox/Carolyn please advise.  [facility] called stated pt has a appointment tomorrow 3/17 with them but they didn't see a referral for him he stated we referred him. I do not see a referral from [place] placed as well. I spoke with Fleet Contras an she is asking if it is from [place] for [place] to send a referral but pt hasn't been seen since 06/2020. Pt need referral? I see that he has an upcoming appointment in April.

## 2020-11-19 NOTE — Progress Notes (Unsigned)
11/20/20- 69 yoM never smoker for sleep evaluation courtesy of Mickey Farber, MD with OSA needing CPAP supplies. Medical problem list includes Ulcerative Colitis/ Ileostomy, Osteoarthritis, Hyperlipidemia,  NPSG 12/30/93- AHI 36/ hr, desaturation to 85%, body weight  LOV with me 2011- for OSA and Insomnia, on autopap.4-20 Epworth score-6 Body weight today- 195 lbs Download- Covid vax-3 Moderna Flu vax-flu He has 2 S9 Resmed machines and a travel machine. Getting old but still working. Now actively needs replacement mask, hoses and supplies. Understands [employer] might require updated study, but he reports excellent compliance and control.  Sleeping well, but ileostomy gets him up a couple of times/ night.  Due to UC he is careful to stay fully vaccinated and he arranged to get BCG after hearing it might help immune system with Covid.  Prior to Admission medications   Medication Sig Start Date End Date Taking? Authorizing Provider  aspirin 81 MG EC tablet Take by mouth.   Yes [provider]  Cholecalciferol 100 MCG (4000 UT) CAPS Take by mouth.   Yes [provider]  Coenzyme Q10 (COQ10) 150 MG CAPS Take by mouth.   Yes [provider]  finasteride (PROSCAR) 5 MG tablet TAKE 1 TABLET BY MOUTH ONCE DAILY 05/28/20  Yes Cox, Kirsten, MD  metFORMIN (GLUCOPHAGE-XR) 500 MG 24 hr tablet Take 1 tablet (500 mg total) by mouth 2 (two) times daily. 12/26/19  Yes Cox, Kirsten, MD  Multiple Vitamin (MULTIVITAMIN) capsule Take by mouth.   Yes [provider]  naproxen sodium (ALEVE) 220 MG tablet Take by mouth.   Yes [provider]  Omega-3 Fatty Acids (FISH OIL) 1000 MG CAPS Take 1 capsule by mouth daily.   Yes [provider]  rosuvastatin (CRESTOR) 5 MG tablet TAKE 1 TABLET BY MOUTH ONCE DAILY 05/28/20  Yes Cox, Kirsten, MD  zinc gluconate 50 MG tablet Take by mouth.   Yes [provider]   Past Medical History:  Diagnosis Date  . Impaired fasting  glucose   . Induration penis plastica   . Mixed hyperlipidemia   . Obstructive sleep apnea   . Unilateral primary osteoarthritis, left knee    Past Surgical History:  Procedure Laterality Date  . APPENDECTOMY    . HERNIA REPAIR  2002   x2  . ILEOSTOMY    . Left knee arthroscopy    . Left partial Knee replacement     Family History  Problem Relation Age of Onset  . Cerebrovascular Accident Mother   . Acute myelogenous leukemia Father   . Prostate cancer Father   . Coronary artery disease Maternal Uncle   . Coronary artery disease Maternal Grandmother   . Stroke Paternal Grandmother   . Stroke Paternal Grandfather    Social History   Socioeconomic History  . Marital status: Married    Spouse name: Not on file  . Number of children: 2  . Years of education: Not on file  . Highest education level: Not on file  Occupational History  . Not on file  Tobacco Use  . Smoking status: Never Smoker  . Smokeless tobacco: Never Used  Vaping Use  . Vaping Use: Never used  Substance and Sexual Activity  . Alcohol use: Yes    Alcohol/week: 5.0 standard drinks    Types: 5 Glasses of wine per week    Comment: Patient drinks socially and typically consumes wine.  . Drug use: Never  . Sexual activity: Yes  Other Topics Concern  . Not  on file  Social History Narrative  . Not on file   Social Determinants of Health   Financial Resource Strain: Not on file  Food Insecurity: Not on file  Transportation Needs: Not on file  Physical Activity: Insufficiently Active  . Days of Exercise per Week: 3 days  . Minutes of Exercise per Session: 20 min  Stress: Not on file  Social Connections: Not on file  Intimate Partner Violence: Not on file   ROS-see HPI  + = positive Constitutional:    weight loss, night sweats, fevers, chills, fatigue, lassitude. HEENT:    headaches, difficulty swallowing, tooth/dental problems, sore throat,       sneezing, itching, ear ache, nasal congestion, post  nasal drip, snoring CV:    chest pain, orthopnea, PND, swelling in lower extremities, anasarca,                                   dizziness, palpitations Resp:   shortness of breath with exertion or at rest.                productive cough,   non-productive cough, coughing up of blood.              change in color of mucus.  wheezing.   Skin:    rash or lesions. GI:  No-   heartburn, indigestion, abdominal pain, nausea, vomiting, diarrhea,                 change in bowel habits, loss of appetite GU: dysuria, change in color of urine, no urgency or frequency.   flank pain. MS:   joint pain, stiffness, decreased range of motion, back pain. Neuro-     nothing unusual Psych:  change in mood or affect.  depression or anxiety.   memory loss.  OBJ- Physical Exam General- Alert, Oriented, Affect-appropriate, Distress- none acute Skin- rash-none, lesions- none, excoriation- none Lymphadenopathy- none Head- atraumatic            Eyes- Gross vision intact, PERRLA, conjunctivae and secretions clear            Ears- Hearing, canals-normal            Nose- Clear, no-Septal dev, mucus, polyps, erosion, perforation             Throat- Mallampati III, mucosa clear , drainage- none, tonsils- atrophic, + teeth Neck- flexible , trachea midline, no stridor , thyroid nl, carotid no bruit Chest - symmetrical excursion , unlabored           Heart/CV- RRR , no murmur , no gallop  , no rub, nl s1 s2                           - JVD- none , edema- none, stasis changes- none, varices- none           Lung- clear to P&A, wheeze- none, cough- none , dullness-none, rub- none           Chest wall-  Abd- + ileostomy bag Br/ Gen/ Rectal- Not done, not indicated Extrem- cyanosis- none, clubbing, none, atrophy- none, strength- nl Neuro- grossly intact to observation

## 2020-11-20 ENCOUNTER — Telehealth: Payer: Self-pay

## 2020-11-20 ENCOUNTER — Ambulatory Visit (INDEPENDENT_AMBULATORY_CARE_PROVIDER_SITE_OTHER): Payer: Medicare Other | Admitting: Internal Medicine

## 2020-11-20 ENCOUNTER — Other Ambulatory Visit: Payer: Self-pay

## 2020-11-20 ENCOUNTER — Encounter: Payer: Self-pay | Admitting: Internal Medicine

## 2020-11-20 VITALS — BP 132/82 | HR 66 | Ht 69.0 in | Wt 195.0 lb

## 2020-11-20 DIAGNOSIS — G4733 Obstructive sleep apnea (adult) (pediatric): Secondary | ICD-10-CM | POA: Diagnosis not present

## 2020-11-20 DIAGNOSIS — K5180 Other ulcerative colitis without complications: Secondary | ICD-10-CM

## 2020-11-20 DIAGNOSIS — G4730 Sleep apnea, unspecified: Secondary | ICD-10-CM

## 2020-11-20 NOTE — Telephone Encounter (Signed)
Pulmonology referral placed.

## 2020-11-20 NOTE — Patient Instructions (Signed)
Order- DME [employer] please replace old CPAP machine, auto 5-20, mask of choice, supplies, hoses/ filters, AirView/ card  Please call as needed

## 2020-11-21 ENCOUNTER — Encounter: Payer: Self-pay | Admitting: Internal Medicine

## 2020-11-21 NOTE — Assessment & Plan Note (Signed)
Benefits with improved sleep Good compliance and control. Machine due for replacement but still working. Most immediate need is to replace mask andd supplies. Plan- replace machine autp 5-20. Replace mask of choice, humidifier, supplies, Airview/ card

## 2020-11-21 NOTE — Assessment & Plan Note (Signed)
Ileostomy impacts sleep, waking him 2x/ night, but he has learned to cope.

## 2020-11-28 ENCOUNTER — Other Ambulatory Visit: Payer: Self-pay | Admitting: Family Medicine

## 2020-12-01 ENCOUNTER — Telehealth: Payer: Self-pay

## 2020-12-01 ENCOUNTER — Other Ambulatory Visit: Payer: Self-pay

## 2020-12-01 MED ORDER — ROSUVASTATIN CALCIUM 5 MG PO TABS: 5.0000 mg | ORAL_TABLET | Freq: Every day | ORAL | 1 refills | Status: DC

## 2020-12-01 MED ORDER — FINASTERIDE 5 MG PO TABS: 5.0000 mg | ORAL_TABLET | Freq: Every day | ORAL | 1 refills | Status: DC

## 2020-12-01 NOTE — Telephone Encounter (Signed)
Received PA for metformin, submitted and not needed... per insurance:  "The patient currently has access to the requested medication and a Prior Authorization is not needed for the patient/medication."

## 2020-12-24 NOTE — Progress Notes (Signed)
Subjective:  Patient ID: Tyler Giles, male    DOB: [birth year]  Age: 70 y.o. MRN: [id number]  Chief Complaint  Patient presents with  . Hyperlipidemia    HPI  Mixed hyperlipidemia Patient is currently taking crestor 5 mg daily and  fish oil. Eats healthy and is very active.   OSA (obstructive sleep apnea) Wears cpap.   Impaired fasting glucose He is taking metformin 500 mg two times a day. Eats healthy and exercise.   History of ulcerative colitis: Has colostomy.     Current Outpatient Medications on File Prior to Visit  Medication Sig Dispense Refill  . celecoxib (CELEBREX) 100 MG capsule Take 100 mg by mouth daily as needed.    Marland Kitchen aspirin 81 MG EC tablet Take by mouth.    . Cholecalciferol 100 MCG (4000 UT) CAPS Take by mouth.    . Coenzyme Q10 (COQ10) 150 MG CAPS Take by mouth.    . finasteride (PROSCAR) 5 MG tablet TAKE 1 TABLET BY MOUTH ONCE DAILY 90 tablet 1  . metFORMIN (GLUCOPHAGE-XR) 500 MG 24 hr tablet Take 1 tablet (500 mg total) by mouth 2 (two) times daily. 180 tablet 3  . Multiple Vitamin (MULTIVITAMIN) capsule Take by mouth.    . naproxen sodium (ALEVE) 220 MG tablet Take by mouth.    . Omega-3 Fatty Acids (FISH OIL) 1000 MG CAPS Take 1 capsule by mouth daily.    . rosuvastatin (CRESTOR) 5 MG tablet TAKE 1 TABLET BY MOUTH ONCE DAILY 90 tablet 1  . zinc gluconate 50 MG tablet Take by mouth.     No current facility-administered medications on file prior to visit.   Past Medical History:  Diagnosis Date  . Impaired fasting glucose   . Induration penis plastica   . Mixed hyperlipidemia   . Obstructive sleep apnea   . Unilateral primary osteoarthritis, left knee    Past Surgical History:  Procedure Laterality Date  . APPENDECTOMY    . HERNIA REPAIR  2002   x2  . ILEOSTOMY    . Left knee arthroscopy    . Left partial Knee replacement    . TOTAL KNEE ARTHROPLASTY Left 09/2020    Family History  Problem Relation Age of Onset  . Cerebrovascular  Accident Mother   . Acute myelogenous leukemia Father   . Prostate cancer Father   . Coronary artery disease Maternal Uncle   . Coronary artery disease Maternal Grandmother   . Stroke Paternal Grandmother   . Stroke Paternal Grandfather    Social History   Socioeconomic History  . Marital status: Married    Spouse name: Not on file  . Number of children: 2  . Years of education: Not on file  . Highest education level: Not on file  Occupational History  . Not on file  Tobacco Use  . Smoking status: Never Smoker  . Smokeless tobacco: Never Used  Vaping Use  . Vaping Use: Never used  Substance and Sexual Activity  . Alcohol use: Yes    Alcohol/week: 5.0 standard drinks    Types: 5 Glasses of wine per week    Comment: Patient drinks socially and typically consumes wine.  . Drug use: Never  . Sexual activity: Yes  Other Topics Concern  . Not on file  Social History Narrative  . Not on file   Social Determinants of Health   Financial Resource Strain: Not on file  Food Insecurity: Not on file  Transportation Needs: Not on file  Physical Activity: Insufficiently Active  . Days of Exercise per Week: 3 days  . Minutes of Exercise per Session: 20 min  Stress: Not on file  Social Connections: Not on file    Review of Systems  Constitutional: Negative for chills, diaphoresis, fatigue and fever.  HENT: Positive for congestion. Negative for ear pain and sore throat.   Respiratory: Negative for cough and shortness of breath.   Cardiovascular: Negative for chest pain and leg swelling.  Gastrointestinal: Negative for abdominal pain, constipation, diarrhea, nausea and vomiting.       Colostomy present.   Genitourinary: Negative for dysuria and urgency.  Musculoskeletal: Negative for arthralgias and myalgias.  Neurological: Positive for numbness (radiculopathy down both arms intermittently. Has known DDD ). Negative for dizziness and headaches.  Psychiatric/Behavioral: Negative  for dysphoric mood.   Objective:  BP 124/78   Pulse 72   Temp (!) 97.4 F (36.3 C)   Resp 14   Ht 5\' 9"  (1.753 m)   Wt 193 lb (87.5 kg)   BMI 28.50 kg/m   BP/Weight 12/25/2020 11/20/2020 06/26/2020  Systolic BP 124 132 116  Diastolic BP 78 82 78  Wt. (Lbs) 193 195 193  BMI 28.5 28.8 28.5    Physical Exam Vitals reviewed.  Constitutional:      Appearance: Normal appearance. He is normal weight.  Neck:     Vascular: No carotid bruit.  Cardiovascular:     Rate and Rhythm: Normal rate and regular rhythm.     Heart sounds: No murmur heard.   Pulmonary:     Effort: Pulmonary effort is normal.     Breath sounds: Normal breath sounds.  Abdominal:     General: Abdomen is flat. Bowel sounds are normal.     Palpations: Abdomen is soft.     Tenderness: There is no abdominal tenderness.     Comments: Colostomy present.  Neurological:     Mental Status: He is alert and oriented to person, place, and time.  Psychiatric:        Mood and Affect: Mood normal.        Behavior: Behavior normal.     Diabetic Foot Exam - Simple   No data filed      Lab Results  Component Value Date   WBC 4.9 06/26/2020   HGB 15.6 06/26/2020   HCT 45.9 06/26/2020   PLT 192 06/26/2020   GLUCOSE 95 06/26/2020   CHOL 162 06/26/2020   TRIG 75 06/26/2020   HDL 54 06/26/2020   LDLCALC 94 06/26/2020   ALT 15 06/26/2020   AST 22 06/26/2020   NA 139 06/26/2020   K 4.7 06/26/2020   CL 102 06/26/2020   CREATININE 1.01 06/26/2020   BUN 17 06/26/2020   CO2 22 06/26/2020   TSH 1.250 06/26/2020   HGBA1C 5.6 06/26/2020      Assessment & Plan:   1. Mixed hyperlipidemia Well controlled.  No changes to medicines.  Continue to work on eating a healthy diet and exercise.  Labs drawn today.  - Lipid panel  2. Impaired fasting glucose Well controlled.  No changes to medicines.  Continue to work on eating a healthy diet and exercise.  Labs drawn today.  - Hemoglobin A1c - Comprehensive  metabolic panel - CBC with Differential/Platelet  3. OA of knee. Resolved with TKR.   4. Colostomy status (HCC) No problems  5. Cervical radiculopathy due to degenerative joint disease of spine  Celebrex 100 mg once daily.  Tyler Giles,acting as a scribe for Blane Ohara, MD.,have documented all relevant documentation on the behalf of Blane Ohara, MD,as directed by  Blane Ohara, MD while in the presence of Blane Ohara, MD.  No orders of the defined types were placed in this encounter.   Orders Placed This Encounter  Procedures  . Lipid panel  . Hemoglobin A1c  . Comprehensive metabolic panel  . CBC with Differential/Platelet     Follow-up: Return in about 6 months (around 06/29/2021) for AWV/FASTING WITH DR. Brittany Osier.  An After Visit Summary was printed and given to the patient.  Blane Ohara, MD [facility] [phone]

## 2020-12-25 ENCOUNTER — Other Ambulatory Visit: Payer: Self-pay

## 2020-12-25 ENCOUNTER — Ambulatory Visit (INDEPENDENT_AMBULATORY_CARE_PROVIDER_SITE_OTHER): Payer: Medicare Other | Admitting: Family Medicine

## 2020-12-25 ENCOUNTER — Encounter: Payer: Self-pay | Admitting: Family Medicine

## 2020-12-25 VITALS — BP 124/78 | HR 72 | Temp 97.4°F | Resp 14 | Ht 69.0 in | Wt 193.0 lb

## 2020-12-25 DIAGNOSIS — G4733 Obstructive sleep apnea (adult) (pediatric): Secondary | ICD-10-CM | POA: Diagnosis not present

## 2020-12-25 DIAGNOSIS — Z933 Colostomy status: Secondary | ICD-10-CM | POA: Diagnosis not present

## 2020-12-25 DIAGNOSIS — E782 Mixed hyperlipidemia: Secondary | ICD-10-CM | POA: Diagnosis not present

## 2020-12-25 DIAGNOSIS — M4722 Other spondylosis with radiculopathy, cervical region: Secondary | ICD-10-CM

## 2020-12-25 DIAGNOSIS — R7301 Impaired fasting glucose: Secondary | ICD-10-CM

## 2020-12-25 NOTE — Patient Instructions (Signed)
Recommend shingrix vaccines.  Recommend continue to work on eating healthy diet and exercise. The current medical regimen is effective;  continue present plan and medications.

## 2020-12-26 LAB — LIPID PANEL
Chol/HDL Ratio: 3.4 ratio (ref 0.0–5.0)
Cholesterol, Total: 161 mg/dL (ref 100–199)
HDL: 48 mg/dL (ref >39)
LDL Chol Calc (NIH): 97 mg/dL (ref 0–99)
Triglycerides: 86 mg/dL (ref 0–149)
VLDL Cholesterol Cal: 16 mg/dL (ref 5–40)

## 2020-12-26 LAB — COMPREHENSIVE METABOLIC PANEL
ALT: 11 IU/L (ref 0–44)
AST: 16 IU/L (ref 0–40)
Albumin/Globulin Ratio: 2.6 — ABNORMAL HIGH (ref 1.2–2.2)
Albumin: 4.7 g/dL (ref 3.8–4.8)
Alkaline Phosphatase: 76 IU/L (ref 44–121)
BUN/Creatinine Ratio: 19 (ref 10–24)
BUN: 20 mg/dL (ref 8–27)
Bilirubin Total: 0.6 mg/dL (ref 0.0–1.2)
CO2: 22 mmol/L (ref 20–29)
Calcium: 9.4 mg/dL (ref 8.6–10.2)
Chloride: 102 mmol/L (ref 96–106)
Creatinine, Ser: 1.03 mg/dL (ref 0.76–1.27)
Globulin, Total: 1.8 g/dL (ref 1.5–4.5)
Glucose: 91 mg/dL (ref 65–99)
Potassium: 4.6 mmol/L (ref 3.5–5.2)
Sodium: 140 mmol/L (ref 134–144)
Total Protein: 6.5 g/dL (ref 6.0–8.5)
eGFR: 79 mL/min/{1.73_m2} (ref >59)

## 2020-12-26 LAB — CBC WITH DIFFERENTIAL/PLATELET
Basophils Absolute: 0 10*3/uL (ref 0.0–0.2)
Basos: 1 %
EOS (ABSOLUTE): 0.1 10*3/uL (ref 0.0–0.4)
Eos: 3 %
Hematocrit: 44.3 % (ref 37.5–51.0)
Hemoglobin: 15.4 g/dL (ref 13.0–17.7)
Immature Grans (Abs): 0 10*3/uL (ref 0.0–0.1)
Immature Granulocytes: 0 %
Lymphocytes Absolute: 0.6 10*3/uL — ABNORMAL LOW (ref 0.7–3.1)
Lymphs: 15 %
MCH: 31 pg (ref 26.6–33.0)
MCHC: 34.8 g/dL (ref 31.5–35.7)
MCV: 89 fL (ref 79–97)
Monocytes Absolute: 0.3 10*3/uL (ref 0.1–0.9)
Monocytes: 8 %
Neutrophils Absolute: 3.2 10*3/uL (ref 1.4–7.0)
Neutrophils: 73 %
Platelets: 206 10*3/uL (ref 150–450)
RBC: 4.96 x10E6/uL (ref 4.14–5.80)
RDW: 12.7 % (ref 11.6–15.4)
WBC: 4.4 10*3/uL (ref 3.4–10.8)

## 2020-12-26 LAB — HEMOGLOBIN A1C
Est. average glucose Bld gHb Est-mCnc: 120 mg/dL
Hgb A1c MFr Bld: 5.8 % — ABNORMAL HIGH (ref 4.8–5.6)

## 2020-12-26 LAB — CARDIOVASCULAR RISK ASSESSMENT

## 2021-02-12 ENCOUNTER — Ambulatory Visit (INDEPENDENT_AMBULATORY_CARE_PROVIDER_SITE_OTHER): Payer: Medicare Other | Admitting: Nurse Practitioner

## 2021-02-12 ENCOUNTER — Encounter: Payer: Self-pay | Admitting: Nurse Practitioner

## 2021-02-12 ENCOUNTER — Other Ambulatory Visit: Payer: Self-pay

## 2021-02-12 VITALS — BP 134/82 | HR 67 | Temp 97.2°F | Ht 69.0 in | Wt 192.0 lb

## 2021-02-12 DIAGNOSIS — Z7409 Other reduced mobility: Secondary | ICD-10-CM | POA: Insufficient documentation

## 2021-02-12 DIAGNOSIS — H9311 Tinnitus, right ear: Secondary | ICD-10-CM | POA: Diagnosis not present

## 2021-02-12 NOTE — Progress Notes (Signed)
Acute Office Visit  Subjective:    Patient ID: Tyler Giles, male    DOB: 1950/12/21, 70 y.o.   MRN: [id number]  Chief Complaint  Patient presents with   Ringing in Right ear    HPI Tyler Giles is a 70 year old Caucasian male that presents for evaluation of right ear tinnitus and decreased hearing. Onset was approximately two weeks ago.He has not tried any treatments at home.States he has previously had impacted cerumen removed from ears.  He states he had a total knee replacement earlier this year. Took ASA post-op to prevent VTE. Tyler Giles states he recently replaced ASA with Natto that he ordered on the Internet from [place], approximately 2 weeks ago. He denies known correlation of Natto and tinnitus. He tells me that his sister has a history of Meniere's disease with tinnitus, vertigo, and hearing loss. States his mother developed hearing loss around age 35. He has not been evaluated by ENT in the past for tinnitus.  Past Medical History:  Diagnosis Date   Impaired fasting glucose    Induration penis plastica    Mixed hyperlipidemia    Obstructive sleep apnea    Unilateral primary osteoarthritis, left knee     Past Surgical History:  Procedure Laterality Date   APPENDECTOMY     HERNIA REPAIR  2002   x2   ILEOSTOMY     Left knee arthroscopy     Left partial Knee replacement     TOTAL KNEE ARTHROPLASTY Left 09/2020    Family History  Problem Relation Age of Onset   Cerebrovascular Accident Mother    Acute myelogenous leukemia Father    Prostate cancer Father    Coronary artery disease Maternal Uncle    Coronary artery disease Maternal Grandmother    Stroke Paternal Grandmother    Stroke Paternal Grandfather     Social History   Socioeconomic History   Marital status: Married    Spouse name: Not on file   Number of children: 2   Years of education: Not on file   Highest education level: Not on file  Occupational History   Not on file  Tobacco Use   Smoking  status: Never   Smokeless tobacco: Never  Vaping Use   Vaping Use: Never used  Substance and Sexual Activity   Alcohol use: Yes    Alcohol/week: 5.0 standard drinks    Types: 5 Glasses of wine per week    Comment: Patient drinks socially and typically consumes wine.   Drug use: Never   Sexual activity: Yes  Other Topics Concern   Not on file  Social History Narrative   Not on file   Social Determinants of Health   Financial Resource Strain: Not on file  Food Insecurity: Not on file  Transportation Needs: Not on file  Physical Activity: Insufficiently Active   Days of Exercise per Week: 3 days   Minutes of Exercise per Session: 20 min  Stress: Not on file  Social Connections: Not on file  Intimate Partner Violence: Not on file    Outpatient Medications Prior to Visit  Medication Sig Dispense Refill   aspirin 81 MG EC tablet Take by mouth.     celecoxib (CELEBREX) 200 MG capsule Take 200 mg by mouth 2 (two) times daily.     Cholecalciferol 100 MCG (4000 UT) CAPS Take by mouth.     Coenzyme Q10 (COQ10) 150 MG CAPS Take by mouth.     finasteride (PROSCAR) 5 MG tablet TAKE  1 TABLET BY MOUTH ONCE DAILY 90 tablet 1   metFORMIN (GLUCOPHAGE-XR) 500 MG 24 hr tablet Take 1 tablet (500 mg total) by mouth 2 (two) times daily. 180 tablet 3   Multiple Vitamin (MULTIVITAMIN) capsule Take by mouth.     Omega-3 Fatty Acids (FISH OIL) 1000 MG CAPS Take 1 capsule by mouth daily.     rosuvastatin (CRESTOR) 5 MG tablet TAKE 1 TABLET BY MOUTH ONCE DAILY 90 tablet 1   zinc gluconate 50 MG tablet Take by mouth.     celecoxib (CELEBREX) 100 MG capsule Take 100 mg by mouth daily as needed.     No facility-administered medications prior to visit.    Allergies  Allergen Reactions   Atorvastatin Other (See Comments)    Muscle cramps    Review of Systems  Constitutional:  Negative for chills, fatigue and fever.  HENT:  Positive for hearing loss (right ear) and tinnitus (right ear). Negative  for congestion, ear pain and sore throat.   Respiratory:  Negative for cough and shortness of breath.   Cardiovascular:  Negative for chest pain and leg swelling.  Gastrointestinal:  Negative for abdominal pain, constipation, diarrhea, nausea and vomiting.  Genitourinary:  Negative for dysuria and frequency.  Musculoskeletal:  Negative for arthralgias and myalgias.  Neurological:  Negative for dizziness and headaches.  Psychiatric/Behavioral:  Negative for dysphoric mood. The patient is not nervous/anxious.       Objective:    Physical Exam Vitals reviewed.  Constitutional:      Appearance: Normal appearance.  HENT:     Right Ear: Tympanic membrane normal.     Left Ear: Tympanic membrane normal.     Nose: Nose normal.     Mouth/Throat:     Mouth: Mucous membranes are moist.  Cardiovascular:     Rate and Rhythm: Normal rate and regular rhythm.     Pulses: Normal pulses.     Heart sounds: Normal heart sounds.  Pulmonary:     Effort: Pulmonary effort is normal.     Breath sounds: Normal breath sounds.  Abdominal:     General: Bowel sounds are normal.     Palpations: Abdomen is soft.  Skin:    General: Skin is warm and dry.     Capillary Refill: Capillary refill takes less than 2 seconds.  Neurological:     General: No focal deficit present.     Mental Status: He is alert and oriented to person, place, and time.  Psychiatric:        Mood and Affect: Mood normal.        Behavior: Behavior normal.    BP 134/82 (BP Location: Left Arm, Patient Position: Sitting)   Pulse 67   Temp (!) 97.2 F (36.2 C) (Temporal)   Ht 5' 9"  (1.753 m)   Wt 192 lb (87.1 kg)   SpO2 96%   BMI 28.35 kg/m  Wt Readings from Last 3 Encounters:  02/12/21 192 lb (87.1 kg)  12/25/20 193 lb (87.5 kg)  11/20/20 195 lb (88.5 kg)    Health Maintenance Due  Topic Date Due   Hepatitis C Screening  Never done   Zoster Vaccines- Shingrix (1 of 2) Never done   COVID-19 Vaccine (4 - Booster for Pfizer  series) 10/13/2020     Lab Results  Component Value Date   TSH 1.250 06/26/2020   Lab Results  Component Value Date   WBC 4.4 12/25/2020   HGB 15.4 12/25/2020   HCT 44.3  12/25/2020   MCV 89 12/25/2020   PLT 206 12/25/2020   Lab Results  Component Value Date   NA 140 12/25/2020   K 4.6 12/25/2020   CO2 22 12/25/2020   GLUCOSE 91 12/25/2020   BUN 20 12/25/2020   CREATININE 1.03 12/25/2020   BILITOT 0.6 12/25/2020   ALKPHOS 76 12/25/2020   AST 16 12/25/2020   ALT 11 12/25/2020   PROT 6.5 12/25/2020   ALBUMIN 4.7 12/25/2020   CALCIUM 9.4 12/25/2020   EGFR 79 12/25/2020   Lab Results  Component Value Date   CHOL 161 12/25/2020   Lab Results  Component Value Date   HDL 48 12/25/2020   Lab Results  Component Value Date   LDLCALC 97 12/25/2020   Lab Results  Component Value Date   TRIG 86 12/25/2020   Lab Results  Component Value Date   CHOLHDL 3.4 12/25/2020   Lab Results  Component Value Date   HGBA1C 5.8 (H) 12/25/2020         Assessment & Plan:   1. Tinnitus of right ear  -stop Natto supplement -resume ASA 81 mg daily -will refer to ENT if no improvement of symptoms  I , Lauren Peterson [place] as a scribe for [employer], NP.,have documented all relevant documentation on the behalf of Rip Harbour, NP,as directed by  Rip Harbour, NP while in the presence of Rip Harbour, NP.   I, Rip Harbour, NP, have reviewed all documentation for this visit. The documentation on 02/13/21 for the exam, diagnosis, procedures, and orders are all accurate and complete.   Follow-up: PRN  Signed, Jerrell Belfast, DNP

## 2021-02-12 NOTE — Patient Instructions (Signed)

## 2021-02-21 ENCOUNTER — Other Ambulatory Visit: Payer: Self-pay | Admitting: Family Medicine

## 2021-05-24 ENCOUNTER — Other Ambulatory Visit: Payer: Self-pay | Admitting: Physician Assistant

## 2021-06-30 NOTE — Progress Notes (Signed)
Subjective:   Tyler Giles is a 70 y.o. male who presents for Medicare Annual/Subsequent preventive examination.  HPI: Patient is a 70 year old white male with history of prediabetes, hyperlipidemia, ulcerative colitis status post colectomy and sleep apnea.  Patient eats healthy and exercises.  Patient is very compliant. Patient is currently on metformin 500 mg twice daily for his prediabetes.For his hyperlipidemia he is taking Crestor 5 mg once daily and coenzyme q10 150 mg daily, as well as OTC fish oil 1 capsule daily..  Again patient eats very healthy.  Patient also takes 1 baby aspirin daily. BPH with minimal symptoms secondary to using finasteride 5 mg once daily.  Orthopedics had put him on Celebrex which she takes very sporadically but works very well when needed.  Review of Systems    Review of Systems  Constitutional:  Negative for fever and malaise/fatigue.  HENT:  Negative for congestion, ear pain and sore throat.   Respiratory:  Negative for cough and shortness of breath.   Cardiovascular:  Negative for chest pain and palpitations.  Gastrointestinal:  Negative for abdominal pain, diarrhea, nausea and vomiting.  Neurological:  Negative for headaches.   Cardiac Risk Factors include: dyslipidemia;advanced age (>14men, >13 women);male gender     Objective:    Today's Vitals   07/01/21 0819  BP: 116/80  Pulse: 81  Temp: (!) 96.1 F (35.6 C)  TempSrc: Temporal  SpO2: 97%  Weight: 195 lb (88.5 kg)  Height: 5\' 9"  (1.753 m)   Body mass index is 28.8 kg/m.  Advanced Directives 07/01/2021 06/29/2020  Does Patient Have a Medical Advance Directive? Yes Yes  Type of 07/01/2020 of [place];Living will Healthcare Power of Attorney  Does patient want to make changes to medical advance directive? No - Patient declined -    Current Medications (verified) Outpatient Encounter Medications as of 07/01/2021  Medication Sig   aspirin 81 MG EC tablet  Take by mouth.   celecoxib (CELEBREX) 200 MG capsule Take 200 mg by mouth 2 (two) times daily.   Cholecalciferol 100 MCG (4000 UT) CAPS Take by mouth.   Coenzyme Q10 (COQ10) 150 MG CAPS Take by mouth.   finasteride (PROSCAR) 5 MG tablet TAKE 1 TABLET BY MOUTH ONCE DAILY   metFORMIN (GLUCOPHAGE-XR) 500 MG 24 hr tablet TAKE 1 TABLET BY MOUTH TWICE A DAY   Multiple Vitamin (MULTIVITAMIN) capsule Take by mouth.   Omega-3 Fatty Acids (FISH OIL) 1000 MG CAPS Take 1 capsule by mouth daily.   rosuvastatin (CRESTOR) 5 MG tablet TAKE 1 TABLET BY MOUTH DAILY.   zinc gluconate 50 MG tablet Take by mouth.   No facility-administered encounter medications on file as of 07/01/2021.    Allergies (verified) Atorvastatin   History: Past Medical History:  Diagnosis Date   Impaired fasting glucose    Induration penis plastica    Mixed hyperlipidemia    Obstructive sleep apnea    Unilateral primary osteoarthritis, left knee    Past Surgical History:  Procedure Laterality Date   APPENDECTOMY     HERNIA REPAIR  2002   x2   ILEOSTOMY     Left knee arthroscopy     Left partial Knee replacement     TOTAL KNEE ARTHROPLASTY Left 09/2020   Family History  Problem Relation Age of Onset   Cerebrovascular Accident Mother    Acute myelogenous leukemia Father    Prostate cancer Father    Coronary artery disease Maternal Uncle    Coronary  artery disease Maternal Grandmother    Stroke Paternal Grandmother    Stroke Paternal Grandfather    Social History   Socioeconomic History   Marital status: Married    Spouse name: Not on file   Number of children: 2   Years of education: Not on file   Highest education level: Not on file  Occupational History   Not on file  Tobacco Use   Smoking status: Never   Smokeless tobacco: Never  Vaping Use   Vaping Use: Never used  Substance and Sexual Activity   Alcohol use: Yes    Alcohol/week: 5.0 standard drinks    Types: 5 Glasses of wine per week     Comment: Patient drinks socially and typically consumes wine.   Drug use: Never   Sexual activity: Yes  Other Topics Concern   Not on file  Social History Narrative   Not on file   Social Determinants of Health   Financial Resource Strain: Low Risk    Difficulty of Paying Living Expenses: Not hard at all  Food Insecurity: No Food Insecurity   Worried About Programme researcher, broadcasting/film/video in the Last Year: Never true   Ran Out of Food in the Last Year: Never true  Transportation Needs: No Transportation Needs   Lack of Transportation (Medical): No   Lack of Transportation (Non-Medical): No  Physical Activity: Sufficiently Active   Days of Exercise per Week: 3 days   Minutes of Exercise per Session: 60 min  Stress: No Stress Concern Present   Feeling of Stress : Not at all  Social Connections: Socially Integrated   Frequency of Communication with Friends and Family: More than three times a week   Frequency of Social Gatherings with Friends and Family: Once a week   Attends Religious Services: More than 4 times per year   Active Member of [employer] or Organizations: Yes   Attends Engineer, structural: More than 4 times per year   Marital Status: Married    Tobacco Counseling Counseling given: Not Answered  Diabetic?NO: prediabetic  Activities of Daily Living In your present state of health, do you have any difficulty performing the following activities: 07/01/2021  Hearing? N  Vision? N  Difficulty concentrating or making decisions? N  Walking or climbing stairs? N  Dressing or bathing? N  Doing errands, shopping? N  Preparing Food and eating ? N  Using the Toilet? N  In the past six months, have you accidently leaked urine? N  Do you have problems with loss of bowel control? N  Managing your Medications? N  Managing your Finances? N  Housekeeping or managing your Housekeeping? N  Some recent data might be hidden    Patient Care Team: Blane Ohara, MD as PCP - General  (Family Medicine)     Assessment:   This is a routine wellness examination for Tyler Giles.  Hearing/Vision screen Hearing Screening  Method: Audiometry   500Hz  1000Hz  2000Hz  4000Hz   Right ear 20 25 20 20   Left ear 20 20 20 20     Dietary issues and exercise activities discussed: Current Exercise Habits: Home exercise routine, Exercise limited by: None identified   Goals Addressed   None   Depression Screen PHQ 2/9 Scores 07/01/2021 06/29/2020 12/26/2019  PHQ - 2 Score 0 0 0    Fall Risk Fall Risk  07/01/2021 06/29/2020  Falls in the past year? 0 0  Number falls in past yr: 0 0  Injury with Fall? 0 0  Risk for fall due to : No Fall Risks No Fall Risks  Follow up - Falls evaluation completed;Falls prevention discussed    FALL RISK PREVENTION PERTAINING TO THE HOME:  Any stairs in or around the home? Yes  If so, are there any without handrails? Yes  Home free of loose throw rugs in walkways, pet beds, electrical cords, etc? Yes  Adequate lighting in your home to reduce risk of falls? Yes   ASSISTIVE DEVICES UTILIZED TO PREVENT FALLS:  Life alert? No  Use of a cane, walker or w/c? No  Grab bars in the bathroom? No  Shower chair or bench in shower? No  Elevated toilet seat or a handicapped toilet? No   TIMED UP AND GO:  Was the test performed?  N/A .  Length of time to ambulate 10 feet: N/A sec.   Gait steady and fast without use of assistive device  Cognitive Function:     6CIT Screen 07/01/2021  What Year? 0 points  What month? 0 points  What time? 0 points  Count back from 20 0 points  Months in reverse 0 points  Repeat phrase 0 points  Total Score 0    Immunizations Immunization History  Administered Date(s) Administered   BCG 02/21/2020   Fluad Quad(high Dose 65+) 06/26/2020   Influenza-Unspecified 06/28/2019, 06/03/2021   PFIZER(Purple Top)SARS-COV-2 Vaccination 09/22/2019, 10/13/2019, 06/12/2020   PPD Test 02/19/2020   Pneumococcal Conjugate-13  05/27/2015   Pneumococcal Polysaccharide-23 07/01/2010, 08/18/2016   Tdap 03/25/2014, 07/05/2018   Zoster Recombinat (Shingrix) 03/10/2021, 06/23/2021   Zoster, Live 04/03/2013    TDAP status: Up to date  Flu Vaccine status: Up to date  Pneumococcal vaccine status: Up to date  Covid-19 vaccine status: Completed vaccines  Qualifies for Shingles Vaccine? Yes   Zostavax completed No   Shingrix Completed?: No.    Education has been provided regarding the importance of this vaccine. Patient has been advised to call insurance company to determine out of pocket expense if they have not yet received this vaccine. Advised may also receive vaccine at local pharmacy or Health Dept. Verbalized acceptance and understanding.  Screening Tests Health Maintenance  Topic Date Due   Hepatitis C Screening  Never done   COVID-19 Vaccine (4 - Booster for Pfizer series) 08/07/2020   TETANUS/TDAP  07/05/2028   Pneumonia Vaccine 73+ Years old  Completed   INFLUENZA VACCINE  Completed   Zoster Vaccines- Shingrix  Completed   HPV VACCINES  Aged Out   COLONOSCOPY (Pts 45-16yrs Insurance coverage will need to be confirmed)  Discontinued    Health Maintenance  Health Maintenance Due  Topic Date Due   Hepatitis C Screening  Never done   COVID-19 Vaccine (4 - Booster for Pfizer series) 08/07/2020    Colorectal cancer screening: No longer required.   Lung Cancer Screening: (Low Dose CT Chest recommended if Age 83-80 years, 30 pack-year currently smoking OR have quit w/in 15years.) does not qualify.   Lung Cancer Screening Referral: N/A  Additional Screening:  Hepatitis C Screening: does qualify; Completed N/A  Vision Screening: Recommended annual ophthalmology exams for early detection of glaucoma and other disorders of the eye. Is the patient up to date with their annual eye exam?  Yes  Who is the provider or what is the name of the office in which the patient attends annual eye exams? [facility] If pt is not established with a provider, would they like to be referred to a provider to establish  care? No .   Dental Screening: Recommended annual dental exams for proper oral hygiene  Community Resource Referral / Chronic Care Management: CRR required this visit?  No   CCM required this visit?  No      Plan:     I have personally reviewed and noted the following in the patient's chart:   Medical and social history Use of alcohol, tobacco or illicit drugs  Current medications and supplements including opioid prescriptions. Patient is not currently taking opioid prescriptions. Functional ability and status Nutritional status Physical activity Advanced directives List of other physicians Hospitalizations, surgeries, and ER visits in previous 12 months Vitals Screenings to include cognitive, depression, and falls Referrals and appointments  In addition, I have reviewed and discussed with patient certain preventive protocols, quality metrics, and best practice recommendations. A written personalized care plan for preventive services as well as general preventive health recommendations were provided to patient.   Problem List Items Addressed This Visit       Respiratory   OSA (obstructive sleep apnea)    Continue mgmt by Dr. Maple Hudson        Endocrine   Impaired fasting glucose    Continue metformin. Recommend continue to work on eating healthy diet and exercise.       Relevant Orders   Hemoglobin A1c (Completed)     Other   Hyperlipidemia    Continue crestor.  Recommend continue to work on eating healthy diet and exercise.       Relevant Orders   CBC with Differential/Platelet (Completed)   Comprehensive metabolic panel (Completed)   Lipid panel (Completed)   TSH (Completed)   Encounter for Medicare annual wellness exam - Primary   Other Visit Diagnoses     Prostate cancer screening       Benign prostatic hyperplasia without lower urinary tract  symptoms       BPH with obstruction/lower urinary tract symptoms       Relevant Orders   PSA (Completed)   Tinnitus of right ear       Relevant Orders   MR BRAIN W CONTRAST   Other specified hearing loss of right ear, unspecified hearing status on contralateral side       Relevant Orders   MR BRAIN W CONTRAST       Blane Ohara, MD   07/03/2021

## 2021-07-01 ENCOUNTER — Ambulatory Visit (INDEPENDENT_AMBULATORY_CARE_PROVIDER_SITE_OTHER): Payer: Medicare Other | Admitting: Family Medicine

## 2021-07-01 ENCOUNTER — Other Ambulatory Visit: Payer: Self-pay

## 2021-07-01 ENCOUNTER — Encounter: Payer: Self-pay | Admitting: Family Medicine

## 2021-07-01 ENCOUNTER — Telehealth: Payer: Self-pay | Admitting: Family Medicine

## 2021-07-01 VITALS — BP 116/80 | HR 81 | Temp 96.1°F | Ht 69.0 in | Wt 195.0 lb

## 2021-07-01 DIAGNOSIS — E782 Mixed hyperlipidemia: Secondary | ICD-10-CM

## 2021-07-01 DIAGNOSIS — Z125 Encounter for screening for malignant neoplasm of prostate: Secondary | ICD-10-CM

## 2021-07-01 DIAGNOSIS — R7301 Impaired fasting glucose: Secondary | ICD-10-CM

## 2021-07-01 DIAGNOSIS — Z0000 Encounter for general adult medical examination without abnormal findings: Secondary | ICD-10-CM | POA: Diagnosis not present

## 2021-07-01 DIAGNOSIS — N401 Enlarged prostate with lower urinary tract symptoms: Secondary | ICD-10-CM

## 2021-07-01 DIAGNOSIS — G4733 Obstructive sleep apnea (adult) (pediatric): Secondary | ICD-10-CM | POA: Diagnosis not present

## 2021-07-01 DIAGNOSIS — Z0001 Encounter for general adult medical examination with abnormal findings: Secondary | ICD-10-CM | POA: Insufficient documentation

## 2021-07-01 DIAGNOSIS — H918X1 Other specified hearing loss, right ear: Secondary | ICD-10-CM

## 2021-07-01 DIAGNOSIS — H9311 Tinnitus, right ear: Secondary | ICD-10-CM

## 2021-07-01 DIAGNOSIS — N138 Other obstructive and reflux uropathy: Secondary | ICD-10-CM

## 2021-07-01 NOTE — Assessment & Plan Note (Signed)
Continue mgmt by Dr. Maple Hudson

## 2021-07-01 NOTE — Assessment & Plan Note (Signed)
Continue metformin. Recommend continue to work on eating healthy diet and exercise.

## 2021-07-01 NOTE — Telephone Encounter (Signed)
   Tyler Giles has been scheduled for the following appointment:  WHAT: BRAIN MRI WHERE: [facility] DATE: 07/06/21 TIME: 11:00 AM ARRIVAL TIME  Patient has been made aware.

## 2021-07-01 NOTE — Assessment & Plan Note (Signed)
Continue crestor.  °Recommend continue to work on eating healthy diet and exercise. ° °

## 2021-07-02 LAB — CBC WITH DIFFERENTIAL/PLATELET
Basophils Absolute: 0 10*3/uL (ref 0.0–0.2)
Basos: 1 %
EOS (ABSOLUTE): 0.1 10*3/uL (ref 0.0–0.4)
Eos: 2 %
Hematocrit: 45.2 % (ref 37.5–51.0)
Hemoglobin: 15.6 g/dL (ref 13.0–17.7)
Immature Grans (Abs): 0 10*3/uL (ref 0.0–0.1)
Immature Granulocytes: 0 %
Lymphocytes Absolute: 0.8 10*3/uL (ref 0.7–3.1)
Lymphs: 17 %
MCH: 30.8 pg (ref 26.6–33.0)
MCHC: 34.5 g/dL (ref 31.5–35.7)
MCV: 89 fL (ref 79–97)
Monocytes Absolute: 0.4 10*3/uL (ref 0.1–0.9)
Monocytes: 8 %
Neutrophils Absolute: 3.5 10*3/uL (ref 1.4–7.0)
Neutrophils: 72 %
Platelets: 208 10*3/uL (ref 150–450)
RBC: 5.06 x10E6/uL (ref 4.14–5.80)
RDW: 12.8 % (ref 11.6–15.4)
WBC: 4.8 10*3/uL (ref 3.4–10.8)

## 2021-07-02 LAB — LIPID PANEL
Chol/HDL Ratio: 3 ratio (ref 0.0–5.0)
Cholesterol, Total: 171 mg/dL (ref 100–199)
HDL: 57 mg/dL (ref >39)
LDL Chol Calc (NIH): 101 mg/dL — ABNORMAL HIGH (ref 0–99)
Triglycerides: 68 mg/dL (ref 0–149)
VLDL Cholesterol Cal: 13 mg/dL (ref 5–40)

## 2021-07-02 LAB — COMPREHENSIVE METABOLIC PANEL
ALT: 10 IU/L (ref 0–44)
AST: 19 IU/L (ref 0–40)
Albumin/Globulin Ratio: 2 (ref 1.2–2.2)
Albumin: 4.3 g/dL (ref 3.8–4.8)
Alkaline Phosphatase: 75 IU/L (ref 44–121)
BUN/Creatinine Ratio: 16 (ref 10–24)
BUN: 18 mg/dL (ref 8–27)
Bilirubin Total: 0.6 mg/dL (ref 0.0–1.2)
CO2: 23 mmol/L (ref 20–29)
Calcium: 9.6 mg/dL (ref 8.6–10.2)
Chloride: 99 mmol/L (ref 96–106)
Creatinine, Ser: 1.12 mg/dL (ref 0.76–1.27)
Globulin, Total: 2.1 g/dL (ref 1.5–4.5)
Glucose: 90 mg/dL (ref 70–99)
Potassium: 4.3 mmol/L (ref 3.5–5.2)
Sodium: 136 mmol/L (ref 134–144)
Total Protein: 6.4 g/dL (ref 6.0–8.5)
eGFR: 71 mL/min/{1.73_m2} (ref >59)

## 2021-07-02 LAB — PSA: Prostate Specific Ag, Serum: 1 ng/mL (ref 0.0–4.0)

## 2021-07-02 LAB — HEMOGLOBIN A1C
Est. average glucose Bld gHb Est-mCnc: 114 mg/dL
Hgb A1c MFr Bld: 5.6 % (ref 4.8–5.6)

## 2021-07-02 LAB — TSH: TSH: 1.42 u[IU]/mL (ref 0.450–4.500)

## 2021-07-02 NOTE — Progress Notes (Signed)
Blood count normal.  Liver function normal.  Kidney function normal.  Cholesterol: good HBA1C: good

## 2021-10-02 ENCOUNTER — Other Ambulatory Visit: Payer: Self-pay

## 2021-10-02 ENCOUNTER — Encounter: Payer: Self-pay | Admitting: Family Medicine

## 2021-10-02 ENCOUNTER — Ambulatory Visit (INDEPENDENT_AMBULATORY_CARE_PROVIDER_SITE_OTHER): Payer: Medicare Other | Admitting: Family Medicine

## 2021-10-02 ENCOUNTER — Ambulatory Visit
Admission: RE | Admit: 2021-10-02 | Discharge: 2021-10-02 | Disposition: A | Discharge status: Home / Self Care | Payer: Medicare Other | Source: Ambulatory Visit | Attending: Family Medicine | Admitting: Family Medicine

## 2021-10-02 VITALS — BP 138/78 | HR 84 | Temp 97.0°F | Resp 16 | Ht 68.5 in | Wt 199.0 lb

## 2021-10-02 DIAGNOSIS — R310 Gross hematuria: Secondary | ICD-10-CM | POA: Diagnosis not present

## 2021-10-02 LAB — COMPREHENSIVE METABOLIC PANEL
ALT: 13 IU/L (ref 0–44)
AST: 17 IU/L (ref 0–40)
Albumin/Globulin Ratio: 2 (ref 1.2–2.2)
Albumin: 4.3 g/dL (ref 3.8–4.8)
Alkaline Phosphatase: 75 IU/L (ref 44–121)
BUN/Creatinine Ratio: 16 (ref 10–24)
BUN: 16 mg/dL (ref 8–27)
Bilirubin Total: 0.4 mg/dL (ref 0.0–1.2)
CO2: 23 mmol/L (ref 20–29)
Calcium: 9.5 mg/dL (ref 8.6–10.2)
Chloride: 103 mmol/L (ref 96–106)
Creatinine, Ser: 0.97 mg/dL (ref 0.76–1.27)
Globulin, Total: 2.1 g/dL (ref 1.5–4.5)
Glucose: 118 mg/dL — ABNORMAL HIGH (ref 70–99)
Potassium: 4.3 mmol/L (ref 3.5–5.2)
Sodium: 139 mmol/L (ref 134–144)
Total Protein: 6.4 g/dL (ref 6.0–8.5)
eGFR: 84 mL/min/{1.73_m2} (ref >59)

## 2021-10-02 LAB — CBC WITH DIFFERENTIAL/PLATELET
Basophils Absolute: 0.1 10*3/uL (ref 0.0–0.2)
Basos: 1 %
EOS (ABSOLUTE): 0.1 10*3/uL (ref 0.0–0.4)
Eos: 2 %
Hematocrit: 44.4 % (ref 37.5–51.0)
Hemoglobin: 15.3 g/dL (ref 13.0–17.7)
Immature Grans (Abs): 0 10*3/uL (ref 0.0–0.1)
Immature Granulocytes: 0 %
Lymphocytes Absolute: 0.7 10*3/uL (ref 0.7–3.1)
Lymphs: 13 %
MCH: 31.5 pg (ref 26.6–33.0)
MCHC: 34.5 g/dL (ref 31.5–35.7)
MCV: 91 fL (ref 79–97)
Monocytes Absolute: 0.4 10*3/uL (ref 0.1–0.9)
Monocytes: 7 %
Neutrophils Absolute: 4.1 10*3/uL (ref 1.4–7.0)
Neutrophils: 77 %
Platelets: 211 10*3/uL (ref 150–450)
RBC: 4.86 x10E6/uL (ref 4.14–5.80)
RDW: 13.3 % (ref 11.6–15.4)
WBC: 5.4 10*3/uL (ref 3.4–10.8)

## 2021-10-02 LAB — POCT URINALYSIS DIPSTICK
Bilirubin, UA: NEGATIVE
Glucose, UA: NEGATIVE
Ketones, UA: NEGATIVE
Leukocytes, UA: NEGATIVE
Nitrite, UA: NEGATIVE
Protein, UA: NEGATIVE
Spec Grav, UA: 1.015 (ref 1.010–1.025)
Urobilinogen, UA: 0.2 E.U./dL
pH, UA: 6 (ref 5.0–8.0)

## 2021-10-02 NOTE — Progress Notes (Signed)
Acute Office Visit  Subjective:    Patient ID: Tyler Giles, male    DOB: 02-09-1951, 71 y.o.   MRN: [id number]  Chief Complaint  Patient presents with   Hematuria        HPI: Patient is in today for gross hematuria.  He experienced fleeting left flank pain yesterday which lasted about 1-2 hours, followed by bright red urine.  His urine cleared in the evening but he passed bright red clots this morning.  Patient sees Dr. Abner Greenspan at [facility]rology.    Past Medical History:  Diagnosis Date   Impaired fasting glucose    Induration penis plastica    Mixed hyperlipidemia    Obstructive sleep apnea    Unilateral primary osteoarthritis, left knee     Past Surgical History:  Procedure Laterality Date   APPENDECTOMY     HERNIA REPAIR  2002   x2   ILEOSTOMY     Left knee arthroscopy     Left partial Knee replacement     TOTAL KNEE ARTHROPLASTY Left 09/2020    Family History  Problem Relation Age of Onset   Cerebrovascular Accident Mother    Acute myelogenous leukemia Father    Prostate cancer Father    Coronary artery disease Maternal Uncle    Coronary artery disease Maternal Grandmother    Stroke Paternal Grandmother    Stroke Paternal Grandfather     Social History   Socioeconomic History   Marital status: Married    Spouse name: Not on file   Number of children: 2   Years of education: Not on file   Highest education level: Not on file  Occupational History   Not on file  Tobacco Use   Smoking status: Never   Smokeless tobacco: Never  Vaping Use   Vaping Use: Never used  Substance and Sexual Activity   Alcohol use: Yes    Alcohol/week: 5.0 standard drinks    Types: 5 Glasses of wine per week    Comment: Patient drinks socially and typically consumes wine.   Drug use: Never   Sexual activity: Yes  Other Topics Concern   Not on file  Social History Narrative   Not on file   Social Determinants of Health   Financial Resource Strain: Low Risk     Difficulty of Paying Living Expenses: Not hard at all  Food Insecurity: No Food Insecurity   Worried About Charity fundraiser in the Last Year: Never true   [place] in the Last Year: Never true  Transportation Needs: No Transportation Needs   Lack of Transportation (Medical): No   Lack of Transportation (Non-Medical): No  Physical Activity: Sufficiently Active   Days of Exercise per Week: 3 days   Minutes of Exercise per Session: 60 min  Stress: No Stress Concern Present   Feeling of Stress : Not at all  Social Connections: Socially Integrated   Frequency of Communication with Friends and Family: More than three times a week   Frequency of Social Gatherings with Friends and Family: Once a week   Attends Religious Services: More than 4 times per year   Active Member of [employer] or Organizations: Yes   Attends Music therapist: More than 4 times per year   Marital Status: Married  Human resources officer Violence: Not At Risk   Fear of Current or Ex-Partner: No   Emotionally Abused: No   Physically Abused: No   Sexually Abused: No  Outpatient Medications Prior to Visit  Medication Sig Dispense Refill   aspirin 81 MG EC tablet Take by mouth.     celecoxib (CELEBREX) 200 MG capsule Take 200 mg by mouth 2 (two) times daily.     Cholecalciferol 100 MCG (4000 UT) CAPS Take by mouth.     Coenzyme Q10 (COQ10) 150 MG CAPS Take by mouth.     finasteride (PROSCAR) 5 MG tablet TAKE 1 TABLET BY MOUTH ONCE DAILY 90 tablet 1   metFORMIN (GLUCOPHAGE-XR) 500 MG 24 hr tablet TAKE 1 TABLET BY MOUTH TWICE A DAY 180 tablet 3   Multiple Vitamin (MULTIVITAMIN) capsule Take by mouth.     Omega-3 Fatty Acids (FISH OIL) 1000 MG CAPS Take 1 capsule by mouth daily.     rosuvastatin (CRESTOR) 5 MG tablet TAKE 1 TABLET BY MOUTH DAILY. 90 tablet 1   zinc gluconate 50 MG tablet Take by mouth.     No facility-administered medications prior to visit.    Allergies  Allergen Reactions    Atorvastatin Other (See Comments)    Muscle cramps    Review of Systems  Constitutional:  Negative for fever.  Respiratory:  Negative for cough.   Cardiovascular:  Negative for chest pain.  Genitourinary:  Positive for hematuria.  Musculoskeletal:  Negative for back pain.  Neurological:  Negative for numbness.      Objective:    Physical Exam Vitals reviewed.  Constitutional:      Appearance: Normal appearance.  Neck:     Vascular: No carotid bruit.  Cardiovascular:     Rate and Rhythm: Normal rate and regular rhythm.     Heart sounds: Normal heart sounds.  Pulmonary:     Effort: Pulmonary effort is normal.     Breath sounds: Normal breath sounds. No wheezing, rhonchi or rales.  Abdominal:     General: Bowel sounds are normal.     Palpations: Abdomen is soft.     Tenderness: There is no abdominal tenderness. There is no right CVA tenderness or left CVA tenderness.  Neurological:     Mental Status: He is alert.  Psychiatric:        Mood and Affect: Mood normal.        Behavior: Behavior normal.    BP 138/78    Pulse 84    Temp (!) 97 F (36.1 C)    Resp 16    Ht 5' 8.5" (1.74 m)    Wt 199 lb (90.3 kg)    BMI 29.82 kg/m  Wt Readings from Last 3 Encounters:  10/02/21 199 lb (90.3 kg)  07/01/21 195 lb (88.5 kg)  02/12/21 192 lb (87.1 kg)    Health Maintenance Due  Topic Date Due   Hepatitis C Screening  Never done   COVID-19 Vaccine (4 - Booster for [place] series) 08/07/2020    There are no preventive care reminders to display for this patient.   Lab Results  Component Value Date   TSH 1.420 07/01/2021   Lab Results  Component Value Date   WBC 5.4 10/02/2021   HGB 15.3 10/02/2021   HCT 44.4 10/02/2021   MCV 91 10/02/2021   PLT 211 10/02/2021   Lab Results  Component Value Date   NA 139 10/02/2021   K 4.3 10/02/2021   CO2 23 10/02/2021   GLUCOSE 118 (H) 10/02/2021   BUN 16 10/02/2021   CREATININE 0.97 10/02/2021   BILITOT 0.4 10/02/2021    ALKPHOS 75 10/02/2021   AST  17 10/02/2021   ALT 13 10/02/2021   PROT 6.4 10/02/2021   ALBUMIN 4.3 10/02/2021   CALCIUM 9.5 10/02/2021   EGFR 84 10/02/2021   Lab Results  Component Value Date   CHOL 171 07/01/2021   Lab Results  Component Value Date   HDL 57 07/01/2021   Lab Results  Component Value Date   LDLCALC 101 (H) 07/01/2021   Lab Results  Component Value Date   TRIG 68 07/01/2021   Lab Results  Component Value Date   CHOLHDL 3.0 07/01/2021   Lab Results  Component Value Date   HGBA1C 5.6 07/01/2021       Assessment & Plan:   Problem List Items Addressed This Visit       Genitourinary   Gross hematuria - Primary    Ordered ct urogram.  If stone present, I will send 3 medicines: Flomax 0.4 mg one twice a day x 14 days Zofran for nausea as needed Pain medicine (hydrocodone) if stone found in case of pain.   Results: no stone present. Only showed BPH. Call made to [facility] and patient set up for appointment on Monday.       Relevant Orders   POCT urinalysis dipstick (Completed)   CT RENAL STONE STUDY (Completed)   CBC with Differential/Platelet (Completed)   Comprehensive metabolic panel (Completed)   Urine Culture (Completed)   No orders of the defined types were placed in this encounter.   Orders Placed This Encounter  Procedures   Urine Culture   CT RENAL STONE STUDY   CBC with Differential/Platelet   Comprehensive metabolic panel   POCT urinalysis dipstick     Follow-up: Return if symptoms worsen or fail to improve.  An After Visit Summary was printed and given to the patient.  Rochel Brome, MD [facility] [phone]

## 2021-10-02 NOTE — Patient Instructions (Signed)
If stone present, I will send 3 medicines: Flomax 0.4 mg one twice a day x 14 days Zofran for nausea as needed Pain medicine (hydrocodone) if stone found in case of pain.

## 2021-10-04 LAB — URINE CULTURE: Organism ID, Bacteria: NO GROWTH

## 2021-10-10 DIAGNOSIS — R310 Gross hematuria: Secondary | ICD-10-CM | POA: Insufficient documentation

## 2021-10-10 NOTE — Assessment & Plan Note (Signed)
Ordered ct urogram.  If stone present, I will send 3 medicines: Flomax 0.4 mg one twice a day x 14 days Zofran for nausea as needed Pain medicine (hydrocodone) if stone found in case of pain.   Results: no stone present. Only showed BPH. Call made to [facility] and patient set up for appointment on Monday.

## 2021-11-06 ENCOUNTER — Other Ambulatory Visit: Payer: Self-pay

## 2021-11-06 ENCOUNTER — Ambulatory Visit (INDEPENDENT_AMBULATORY_CARE_PROVIDER_SITE_OTHER): Payer: Medicare Other | Admitting: Family Medicine

## 2021-11-06 VITALS — BP 106/64 | HR 72 | Temp 97.0°F | Resp 14 | Ht 68.5 in | Wt 192.8 lb

## 2021-11-06 DIAGNOSIS — R918 Other nonspecific abnormal finding of lung field: Secondary | ICD-10-CM | POA: Diagnosis not present

## 2021-11-06 DIAGNOSIS — I700 Atherosclerosis of aorta: Secondary | ICD-10-CM | POA: Diagnosis not present

## 2021-11-06 DIAGNOSIS — N400 Enlarged prostate without lower urinary tract symptoms: Secondary | ICD-10-CM | POA: Diagnosis not present

## 2021-11-06 NOTE — Progress Notes (Signed)
? ?Subjective:  ?Patient ID: Tyler Giles, male    DOB: 28-Jun-1951  Age: 71 y.o. MRN: [id number] ? ?Chief Complaint  ?Patient presents with  ? discuss CT results   ? ?HPI ?Patient was noted to have mild ground glass and bandlike changes in the anterior right middle lobe.  No stones present. Aortic atherosclerosis was noted.  Patient had covid 19 in the last 3 months. This is likely cause of ground glass appearance. Patient denies shortness of breath or coughing. Patient has aortic atherosclerosis and is currently on rosuvastatin and aspirin.  ? ?Patient was sent to urology for work up of hematuria. Cystoscopy done and was normal other than prostate protruded into bladder. No abnormal bladder findings in bladder. Biopsies were normal. Patient is on finasteride 5 mg daily.  ? ?Current Outpatient Medications on File Prior to Visit  ?Medication Sig Dispense Refill  ? aspirin 81 MG EC tablet Take by mouth.    ? celecoxib (CELEBREX) 200 MG capsule Take 200 mg by mouth 2 (two) times daily.    ? Cholecalciferol 100 MCG (4000 UT) CAPS Take by mouth.    ? Coenzyme Q10 (COQ10) 150 MG CAPS Take by mouth.    ? finasteride (PROSCAR) 5 MG tablet TAKE 1 TABLET BY MOUTH ONCE DAILY 90 tablet 1  ? metFORMIN (GLUCOPHAGE-XR) 500 MG 24 hr tablet TAKE 1 TABLET BY MOUTH TWICE A DAY 180 tablet 3  ? Multiple Vitamin (MULTIVITAMIN) capsule Take by mouth.    ? Omega-3 Fatty Acids (FISH OIL) 1000 MG CAPS Take 1 capsule by mouth daily.    ? rosuvastatin (CRESTOR) 5 MG tablet TAKE 1 TABLET BY MOUTH DAILY. 90 tablet 1  ? zinc gluconate 50 MG tablet Take by mouth.    ? ?No current facility-administered medications on file prior to visit.  ? ?Past Medical History:  ?Diagnosis Date  ? Impaired fasting glucose   ? Induration penis plastica   ? Mixed hyperlipidemia   ? Obstructive sleep apnea   ? Unilateral primary osteoarthritis, left knee   ? ?Past Surgical History:  ?Procedure Laterality Date  ? APPENDECTOMY    ? HERNIA REPAIR  2002  ? x2  ?  ILEOSTOMY    ? Left knee arthroscopy    ? Left partial Knee replacement    ? TOTAL KNEE ARTHROPLASTY Left 09/2020  ?  ?Family History  ?Problem Relation Age of Onset  ? Cerebrovascular Accident Mother   ? Acute myelogenous leukemia Father   ? Prostate cancer Father   ? Coronary artery disease Maternal Uncle   ? Coronary artery disease Maternal Grandmother   ? Stroke Paternal Grandmother   ? Stroke Paternal Grandfather   ? ?Social History  ? ?Socioeconomic History  ? Marital status: Married  ?  Spouse name: Not on file  ? Number of children: 2  ? Years of education: Not on file  ? Highest education level: Not on file  ?Occupational History  ? Not on file  ?Tobacco Use  ? Smoking status: Never  ? Smokeless tobacco: Never  ?Vaping Use  ? Vaping Use: Never used  ?Substance and Sexual Activity  ? Alcohol use: Yes  ?  Alcohol/week: 5.0 standard drinks  ?  Types: 5 Glasses of wine per week  ?  Comment: Patient drinks socially and typically consumes wine.  ? Drug use: Never  ? Sexual activity: Yes  ?Other Topics Concern  ? Not on file  ?Social History Narrative  ? Not on file  ? ?  Social Determinants of Health  ? ?Financial Resource Strain: Low Risk   ? Difficulty of Paying Living Expenses: Not hard at all  ?Food Insecurity: No Food Insecurity  ? Worried About Charity fundraiser in the Last Year: Never true  ? Ran Out of Food in the Last Year: Never true  ?Transportation Needs: No Transportation Needs  ? Lack of Transportation (Medical): No  ? Lack of Transportation (Non-Medical): No  ?Physical Activity: Sufficiently Active  ? Days of Exercise per Week: 3 days  ? Minutes of Exercise per Session: 60 min  ?Stress: No Stress Concern Present  ? Feeling of Stress : Not at all  ?Social Connections: Socially Integrated  ? Frequency of Communication with Friends and Family: More than three times a week  ? Frequency of Social Gatherings with Friends and Family: Once a week  ? Attends Religious Services: More than 4 times per year  ?  Active Member of Clubs or Organizations: Yes  ? Attends Archivist Meetings: More than 4 times per year  ? Marital Status: Married  ? ? ?Review of Systems  ?Constitutional:  Negative for chills and fever.  ?HENT:  Negative for congestion, rhinorrhea and sore throat.   ?Respiratory:  Negative for cough and shortness of breath.   ?Cardiovascular:  Negative for chest pain and palpitations.  ?Gastrointestinal:  Negative for abdominal pain, constipation, diarrhea, nausea and vomiting.  ?Genitourinary:  Negative for dysuria and urgency.  ?Musculoskeletal:  Negative for arthralgias, back pain and myalgias.  ?Neurological:  Negative for dizziness and headaches.  ?Psychiatric/Behavioral:  Negative for dysphoric mood. The patient is not nervous/anxious.   ? ? ?Objective:  ?BP 106/64   Pulse 72   Temp (!) 97 ?F (36.1 ?C)   Resp 14   Ht 5' 8.5" (1.74 m)   Wt 192 lb 12.8 oz (87.5 kg)   BMI 28.89 kg/m?  ? ?BP/Weight 11/06/2021 10/02/2021 07/01/2021  ?Systolic BP [id number] 0000000 [id number]  ?Diastolic BP 64 78 80  ?Wt. (Lbs) 192.8 199 195  ?BMI 28.89 29.82 28.8  ? ? ?Physical Exam ?Vitals reviewed.  ?Constitutional:   ?   Appearance: Normal appearance.  ?Cardiovascular:  ?   Rate and Rhythm: Normal rate and regular rhythm.  ?   Heart sounds: Normal heart sounds.  ?Pulmonary:  ?   Effort: Pulmonary effort is normal.  ?   Breath sounds: Normal breath sounds. No wheezing, rhonchi or rales.  ?Neurological:  ?   Mental Status: He is alert and oriented to person, place, and time.  ?Psychiatric:     ?   Mood and Affect: Mood normal.     ?   Behavior: Behavior normal.  ? ? ?Diabetic Foot Exam - Simple   ?No data filed ?  ?  ? ?Lab Results  ?Component Value Date  ? WBC 5.4 10/02/2021  ? HGB 15.3 10/02/2021  ? HCT 44.4 10/02/2021  ? PLT 211 10/02/2021  ? GLUCOSE 118 (H) 10/02/2021  ? CHOL 171 07/01/2021  ? TRIG 68 07/01/2021  ? HDL 57 07/01/2021  ? LDLCALC 101 (H) 07/01/2021  ? ALT 13 10/02/2021  ? AST 17 10/02/2021  ? NA 139 10/02/2021  ? K  4.3 10/02/2021  ? CL 103 10/02/2021  ? CREATININE 0.97 10/02/2021  ? BUN 16 10/02/2021  ? CO2 23 10/02/2021  ? TSH 1.420 07/01/2021  ? HGBA1C 5.6 07/01/2021  ? ? ? ? ?Assessment & Plan:  ? ?Problem List Items Addressed This Visit   ? ?  ?  Cardiovascular and Mediastinum  ? Aortic atherosclerosis ([place])  ?  LDL goal is less than 70.  ?Recommend increase crestor to 10 mg before bed.  ?Recheck lipids/CMP in 3 months.  ?  ?  ?  ? Genitourinary  ? Benign prostatic hyperplasia without lower urinary tract symptoms  ?  Management per specialist.  ?Continue finasteride 5 mg daily.  ?  ?  ?  ? Other  ? Ground glass opacity present on imaging of lung - Primary  ?  Likely secondary to recent covid 19 infection.  ?If patient starts to experience shortness of breath or coughing, recommend call and we will reevaluate and consider a ct of chest.  ?  ?  ?. ? ?Follow-up: Return if symptoms worsen or fail to improve. ? ?An After Visit Summary was printed and given to the patient. ? ?Rochel Brome, MD ?[place] ?[phone]

## 2021-11-15 ENCOUNTER — Encounter: Payer: Self-pay | Admitting: Family Medicine

## 2021-11-15 DIAGNOSIS — R918 Other nonspecific abnormal finding of lung field: Secondary | ICD-10-CM | POA: Insufficient documentation

## 2021-11-15 DIAGNOSIS — N400 Enlarged prostate without lower urinary tract symptoms: Secondary | ICD-10-CM | POA: Insufficient documentation

## 2021-11-15 DIAGNOSIS — I700 Atherosclerosis of aorta: Secondary | ICD-10-CM | POA: Insufficient documentation

## 2021-11-15 HISTORY — DX: Other nonspecific abnormal finding of lung field: R91.8

## 2021-11-15 NOTE — Assessment & Plan Note (Signed)
Likely secondary to recent covid 19 infection.  ?If patient starts to experience shortness of breath or coughing, recommend call and we will reevaluate and consider a ct of chest.  ?

## 2021-11-15 NOTE — Assessment & Plan Note (Signed)
Management per specialist.  ?Continue finasteride 5 mg daily.  ?

## 2021-11-15 NOTE — Assessment & Plan Note (Signed)
LDL goal is less than 70.  ?Recommend increase crestor to 10 mg before bed.  ?Recheck lipids/CMP in 3 months.  ?

## 2021-11-17 ENCOUNTER — Other Ambulatory Visit: Payer: Self-pay | Admitting: Physician Assistant

## 2021-12-30 ENCOUNTER — Encounter: Payer: Self-pay | Admitting: Family Medicine

## 2021-12-30 ENCOUNTER — Ambulatory Visit (INDEPENDENT_AMBULATORY_CARE_PROVIDER_SITE_OTHER): Payer: Medicare Other | Admitting: Family Medicine

## 2021-12-30 VITALS — BP 128/84 | HR 71 | Temp 98.1°F | Ht 68.5 in | Wt 191.0 lb

## 2021-12-30 DIAGNOSIS — E782 Mixed hyperlipidemia: Secondary | ICD-10-CM | POA: Diagnosis not present

## 2021-12-30 DIAGNOSIS — N400 Enlarged prostate without lower urinary tract symptoms: Secondary | ICD-10-CM

## 2021-12-30 DIAGNOSIS — R7301 Impaired fasting glucose: Secondary | ICD-10-CM | POA: Diagnosis not present

## 2021-12-30 DIAGNOSIS — G4733 Obstructive sleep apnea (adult) (pediatric): Secondary | ICD-10-CM | POA: Diagnosis not present

## 2021-12-30 DIAGNOSIS — R918 Other nonspecific abnormal finding of lung field: Secondary | ICD-10-CM

## 2021-12-30 DIAGNOSIS — I700 Atherosclerosis of aorta: Secondary | ICD-10-CM | POA: Diagnosis not present

## 2021-12-30 NOTE — Progress Notes (Signed)
? ?Subjective:  ?Patient ID: Tyler Giles, male    DOB: 11/08/1950  Age: 71 y.o. MRN: [id number] ? ?Chief Complaint  ?Patient presents with  ? Hyperlipidemia  ? ?HPI: ?Hyperlipidemia/aortic atherosclerosis:  ?Patient is currently taking rosuvastatin 5 mg daily.  Aspirin 81 mg daily. Patient eats healthy.  Patient takes coenzyme every 10.  Takes fish oil 1000 mg 1 p.o. daily. ? ?Obstructive Sleep Apnea: On cpap.  ? ?BPH: Currently on tamsulosin 0.4 mg ?Returned to urology had ct scan with contrast. Has a very enlarged prostate.  ? ?Prediabetes: Currently on metformin XR 500 mg once daily ? ? Eats healthy and exercises. ?Has an colostomy. No problems with it.  ?Current Outpatient Medications on File Prior to Visit  ?Medication Sig Dispense Refill  ? aspirin 81 MG EC tablet Take by mouth.    ? augmented betamethasone dipropionate (DIPROLENE-AF) 0.05 % cream Apply topically.    ? celecoxib (CELEBREX) 200 MG capsule Take 200 mg by mouth 2 (two) times daily.    ? Cholecalciferol 100 MCG (4000 UT) CAPS Take by mouth.    ? clobetasol cream (TEMOVATE) [id number] % Apply 1 application. topically daily.    ? Coenzyme Q10 (COQ10) 150 MG CAPS Take by mouth.    ? finasteride (PROSCAR) 5 MG tablet TAKE 1 TABLET BY MOUTH ONCE DAILY 90 tablet 1  ? fluorouracil (EFUDEX) 5 % cream Apply topically.    ? metFORMIN (GLUCOPHAGE-XR) 500 MG 24 hr tablet TAKE 1 TABLET BY MOUTH TWICE A DAY 180 tablet 3  ? Multiple Vitamin (MULTIVITAMIN) capsule Take by mouth.    ? Omega-3 Fatty Acids (FISH OIL) 1000 MG CAPS Take 1 capsule by mouth daily.    ? rosuvastatin (CRESTOR) 5 MG tablet TAKE 1 TABLET BY MOUTH EVERY DAY 90 tablet 0  ? tamsulosin (FLOMAX) 0.4 MG CAPS capsule Take by mouth.    ? zinc gluconate 50 MG tablet Take by mouth.    ? ?No current facility-administered medications on file prior to visit.  ? ?Past Medical History:  ?Diagnosis Date  ? Impaired fasting glucose   ? Induration penis plastica   ? Mixed hyperlipidemia   ? Obstructive sleep  apnea   ? Unilateral primary osteoarthritis, left knee   ? ?Past Surgical History:  ?Procedure Laterality Date  ? APPENDECTOMY    ? HERNIA REPAIR  2002  ? x2  ? ILEOSTOMY    ? Left knee arthroscopy    ? Left partial Knee replacement    ? TOTAL KNEE ARTHROPLASTY Left 09/2020  ?  ?Family History  ?Problem Relation Age of Onset  ? Cerebrovascular Accident Mother   ? Acute myelogenous leukemia Father   ? Prostate cancer Father   ? Coronary artery disease Maternal Uncle   ? Coronary artery disease Maternal Grandmother   ? Stroke Paternal Grandmother   ? Stroke Paternal Grandfather   ? ?Social History  ? ?Socioeconomic History  ? Marital status: Married  ?  Spouse name: Not on file  ? Number of children: 2  ? Years of education: Not on file  ? Highest education level: Not on file  ?Occupational History  ? Not on file  ?Tobacco Use  ? Smoking status: Never  ? Smokeless tobacco: Never  ?Vaping Use  ? Vaping Use: Never used  ?Substance and Sexual Activity  ? Alcohol use: Yes  ?  Alcohol/week: 5.0 standard drinks  ?  Types: 5 Glasses of wine per week  ?  Comment: Patient drinks socially  and typically consumes wine.  ? Drug use: Never  ? Sexual activity: Yes  ?Other Topics Concern  ? Not on file  ?Social History Narrative  ? Not on file  ? ?Social Determinants of Health  ? ?Financial Resource Strain: Low Risk   ? Difficulty of Paying Living Expenses: Not hard at all  ?Food Insecurity: No Food Insecurity  ? Worried About Charity fundraiser in the Last Year: Never true  ? Ran Out of Food in the Last Year: Never true  ?Transportation Needs: No Transportation Needs  ? Lack of Transportation (Medical): No  ? Lack of Transportation (Non-Medical): No  ?Physical Activity: Sufficiently Active  ? Days of Exercise per Week: 3 days  ? Minutes of Exercise per Session: 60 min  ?Stress: No Stress Concern Present  ? Feeling of Stress : Not at all  ?Social Connections: Socially Integrated  ? Frequency of Communication with Friends and Family:  More than three times a week  ? Frequency of Social Gatherings with Friends and Family: Once a week  ? Attends Religious Services: More than 4 times per year  ? Active Member of Clubs or Organizations: Yes  ? Attends Archivist Meetings: More than 4 times per year  ? Marital Status: Married  ? ? ?Review of Systems  ?Constitutional:  Negative for appetite change, fatigue and fever.  ?HENT:  Negative for congestion, ear pain, sinus pressure and sore throat.   ?Respiratory:  Negative for cough, chest tightness, shortness of breath and wheezing.   ?Cardiovascular:  Negative for chest pain and palpitations.  ?Gastrointestinal:  Negative for abdominal pain, constipation, diarrhea, nausea and vomiting.  ?Genitourinary:  Negative for dysuria and hematuria.  ?Musculoskeletal:  Negative for arthralgias, back pain, joint swelling and myalgias.  ?Skin:  Negative for rash.  ?Neurological:  Negative for dizziness, weakness and headaches.  ?Psychiatric/Behavioral:  Negative for dysphoric mood. The patient is not nervous/anxious.   ? ? ?Objective:  ?BP 128/84 (BP Location: Left Arm, Patient Position: Sitting)   Pulse 71   Temp 98.1 ?F (36.7 ?C) (Oral)   Ht 5' 8.5" (1.74 m)   Wt 191 lb (86.6 kg)   SpO2 97%   BMI 28.62 kg/m?  ? ? ?  12/30/2021  ?  8:31 AM 11/06/2021  ?  7:53 AM 10/02/2021  ?  8:52 AM  ?BP/Weight  ?Systolic BP 0000000 A999333 0000000  ?Diastolic BP 84 64 78  ?Wt. (Lbs) 191 192.8 199  ?BMI 28.62 kg/m2 28.89 kg/m2 29.82 kg/m2  ? ? ?Physical Exam ?Vitals reviewed.  ?Constitutional:   ?   Appearance: Normal appearance. He is normal weight.  ?Cardiovascular:  ?   Rate and Rhythm: Normal rate and regular rhythm.  ?   Pulses: Normal pulses.  ?   Heart sounds: Normal heart sounds.  ?Pulmonary:  ?   Effort: Pulmonary effort is normal.  ?   Breath sounds: Normal breath sounds.  ?Abdominal:  ?   General: Abdomen is flat. Bowel sounds are normal.  ?   Palpations: Abdomen is soft.  ?Neurological:  ?   Mental Status: He is alert  and oriented to person, place, and time.  ?Psychiatric:     ?   Mood and Affect: Mood normal.     ?   Behavior: Behavior normal.  ? ? ?Diabetic Foot Exam - Simple   ?No data filed ?  ?  ? ?Lab Results  ?Component Value Date  ? WBC 5.4 10/02/2021  ? HGB  15.3 10/02/2021  ? HCT 44.4 10/02/2021  ? PLT 211 10/02/2021  ? GLUCOSE 118 (H) 10/02/2021  ? CHOL 171 07/01/2021  ? TRIG 68 07/01/2021  ? HDL 57 07/01/2021  ? LDLCALC 101 (H) 07/01/2021  ? ALT 13 10/02/2021  ? AST 17 10/02/2021  ? NA 139 10/02/2021  ? K 4.3 10/02/2021  ? CL 103 10/02/2021  ? CREATININE 0.97 10/02/2021  ? BUN 16 10/02/2021  ? CO2 23 10/02/2021  ? TSH 1.420 07/01/2021  ? HGBA1C 5.6 07/01/2021  ? ? ? ? ?Assessment & Plan:  ? ?Problem List Items Addressed This Visit   ? ?  ? Cardiovascular and Mediastinum  ? Aortic atherosclerosis ([place])  ?  Continue aspirin 81 mg daily and crestor 5 mg before bed.  ? ?  ?  ?  ? Respiratory  ? OSA (obstructive sleep apnea) - Primary  ?  Complaint with cpap. Continue cpap  ? ?  ?  ?  ? Endocrine  ? Impaired fasting glucose  ? Relevant Orders  ? Hemoglobin A1c  ?  ? Genitourinary  ? Benign prostatic hyperplasia without lower urinary tract symptoms  ?  Continue tamsulosin ? ?  ?  ? Relevant Medications  ? tamsulosin (FLOMAX) 0.4 MG CAPS capsule  ?  ? Other  ? Ground glass opacity present on imaging of lung  ? Hyperlipidemia  ? Relevant Orders  ? CBC with Differential/Platelet  ? Comprehensive metabolic panel  ? Lipid panel  ?. ? ?No orders of the defined types were placed in this encounter. ? ? ?Orders Placed This Encounter  ?Procedures  ? CBC with Differential/Platelet  ? Comprehensive metabolic panel  ? Hemoglobin A1c  ? Lipid panel  ?  ? ?Follow-up: Return in about 6 months (around 07/02/2022) for awv, chronic fasting. ? ?An After Visit Summary was printed and given to the patient. ? ? ?I,Lauren M Auman,acting as a scribe for Rochel Brome, MD.,have documented all relevant documentation on the behalf of Rochel Brome, MD,as  directed by  Rochel Brome, MD while in the presence of Rochel Brome, MD.  ? ?Rochel Brome, MD ?[place] ?[phone] ?

## 2021-12-30 NOTE — Assessment & Plan Note (Signed)
Complaint with cpap. Continue cpap  ?

## 2021-12-30 NOTE — Assessment & Plan Note (Signed)
Continue tamsulosin.

## 2021-12-30 NOTE — Assessment & Plan Note (Signed)
Continue aspirin 81 mg daily and crestor 5 mg before bed.  ?

## 2021-12-31 ENCOUNTER — Ambulatory Visit (INDEPENDENT_AMBULATORY_CARE_PROVIDER_SITE_OTHER): Payer: Medicare Other

## 2021-12-31 DIAGNOSIS — Z23 Encounter for immunization: Secondary | ICD-10-CM | POA: Diagnosis not present

## 2021-12-31 LAB — COMPREHENSIVE METABOLIC PANEL
ALT: 15 IU/L (ref 0–44)
AST: 21 IU/L (ref 0–40)
Albumin/Globulin Ratio: 1.9 (ref 1.2–2.2)
Albumin: 4.4 g/dL (ref 3.8–4.8)
Alkaline Phosphatase: 83 IU/L (ref 44–121)
BUN/Creatinine Ratio: 12 (ref 10–24)
BUN: 13 mg/dL (ref 8–27)
Bilirubin Total: 0.6 mg/dL (ref 0.0–1.2)
CO2: 22 mmol/L (ref 20–29)
Calcium: 9.6 mg/dL (ref 8.6–10.2)
Chloride: 103 mmol/L (ref 96–106)
Creatinine, Ser: 1.07 mg/dL (ref 0.76–1.27)
Globulin, Total: 2.3 g/dL (ref 1.5–4.5)
Glucose: 97 mg/dL (ref 70–99)
Potassium: 4.8 mmol/L (ref 3.5–5.2)
Sodium: 140 mmol/L (ref 134–144)
Total Protein: 6.7 g/dL (ref 6.0–8.5)
eGFR: 75 mL/min/{1.73_m2} (ref >59)

## 2021-12-31 LAB — CBC WITH DIFFERENTIAL/PLATELET
Basophils Absolute: 0.1 10*3/uL (ref 0.0–0.2)
Basos: 1 %
EOS (ABSOLUTE): 0.2 10*3/uL (ref 0.0–0.4)
Eos: 3 %
Hematocrit: 47 % (ref 37.5–51.0)
Hemoglobin: 15.8 g/dL (ref 13.0–17.7)
Immature Grans (Abs): 0 10*3/uL (ref 0.0–0.1)
Immature Granulocytes: 0 %
Lymphocytes Absolute: 0.7 10*3/uL (ref 0.7–3.1)
Lymphs: 15 %
MCH: 30.6 pg (ref 26.6–33.0)
MCHC: 33.6 g/dL (ref 31.5–35.7)
MCV: 91 fL (ref 79–97)
Monocytes Absolute: 0.4 10*3/uL (ref 0.1–0.9)
Monocytes: 8 %
Neutrophils Absolute: 3.5 10*3/uL (ref 1.4–7.0)
Neutrophils: 73 %
Platelets: 206 10*3/uL (ref 150–450)
RBC: 5.17 x10E6/uL (ref 4.14–5.80)
RDW: 13.4 % (ref 11.6–15.4)
WBC: 4.8 10*3/uL (ref 3.4–10.8)

## 2021-12-31 LAB — LIPID PANEL
Chol/HDL Ratio: 3 ratio (ref 0.0–5.0)
Cholesterol, Total: 178 mg/dL (ref 100–199)
HDL: 60 mg/dL (ref >39)
LDL Chol Calc (NIH): 102 mg/dL — ABNORMAL HIGH (ref 0–99)
Triglycerides: 90 mg/dL (ref 0–149)
VLDL Cholesterol Cal: 16 mg/dL (ref 5–40)

## 2021-12-31 LAB — HEMOGLOBIN A1C
Est. average glucose Bld gHb Est-mCnc: 114 mg/dL
Hgb A1c MFr Bld: 5.6 % (ref 4.8–5.6)

## 2021-12-31 NOTE — Progress Notes (Signed)
Blood count normal.  ?Liver function normal.  ?Kidney function normal.  ?We discussed aortic atherosclerosis seen on ct scan.  ?Cholesterol: goal was < 100, but with aortic atherosclerosis goal was < 70. Recommend increase crestor 10 mg before bed.  ?HBA1C: 5.6 ?

## 2022-01-01 ENCOUNTER — Other Ambulatory Visit: Payer: Self-pay

## 2022-01-01 MED ORDER — ROSUVASTATIN CALCIUM 10 MG PO TABS
10.0000 mg | ORAL_TABLET | Freq: Every day | ORAL | 1 refills | Status: DC
Start: 2022-01-01 — End: 2022-03-29

## 2022-02-12 ENCOUNTER — Other Ambulatory Visit: Payer: Self-pay | Admitting: Physician Assistant

## 2022-02-14 ENCOUNTER — Other Ambulatory Visit: Payer: Self-pay | Admitting: Family Medicine

## 2022-02-23 IMAGING — CT CT RENAL STONE PROTOCOL
2 of 4 series · 12 of 46 positions shown, 14 images · non-contrast
Comparison: Ultrasound evaluation from [id number].

CLINICAL DATA: Gross hematuria in LEFT mid back pain with brown/red
urine.



[Series 2: renal stone 5.00 br40 s3 axial · axial · 0.63mm/px · z∈[+1248,+1638]mm · 9 of 94 slices shown, 11 images]
[im 8/94  soft-tissue]
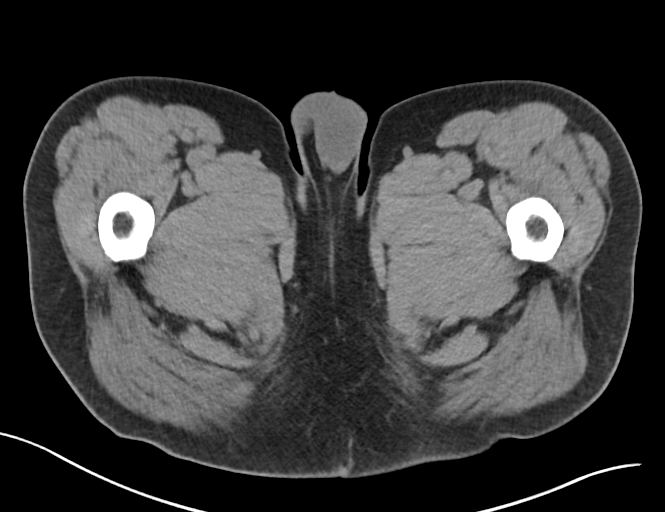
[im 8/94  bone]
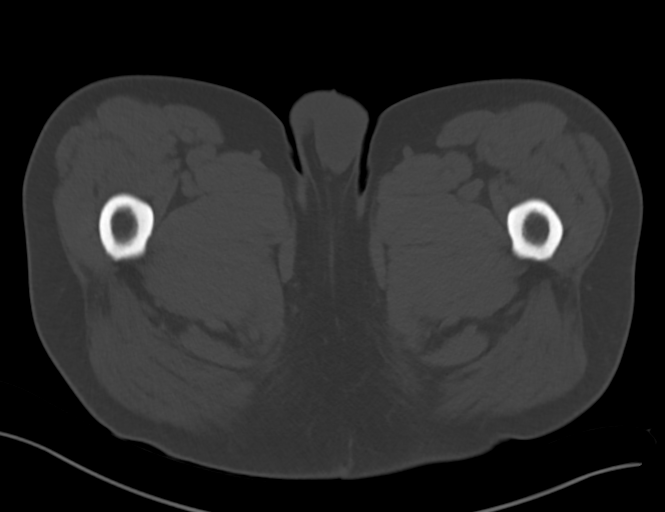
[im 19/94  soft-tissue]
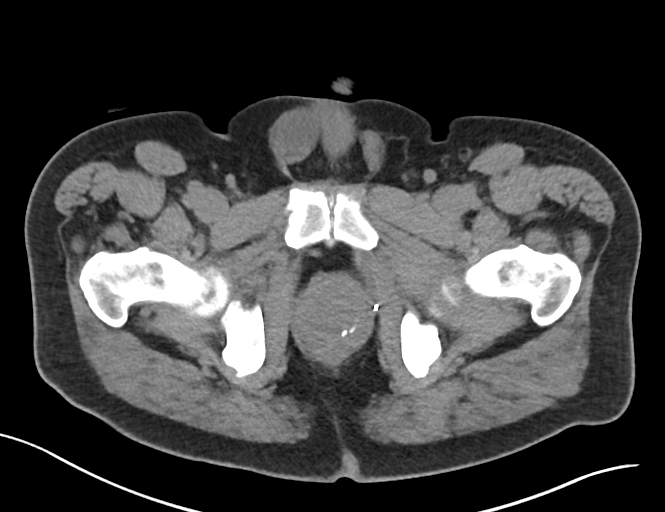
[im 27/94  soft-tissue]
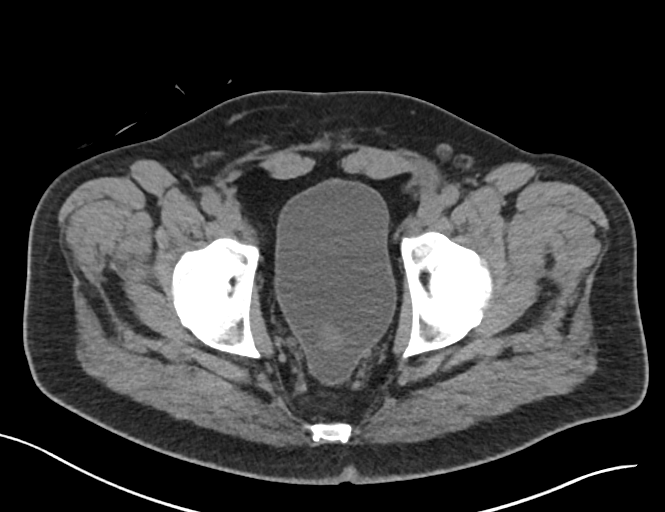
[im 38/94  soft-tissue]
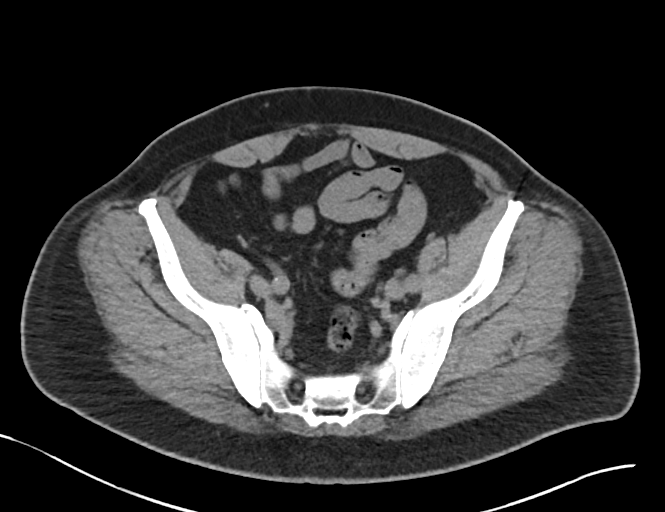
[im 49/94  soft-tissue]
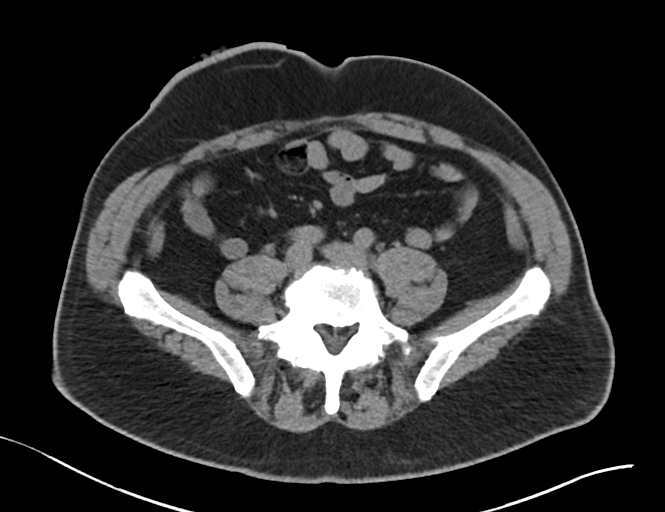
[im 56/94  soft-tissue]
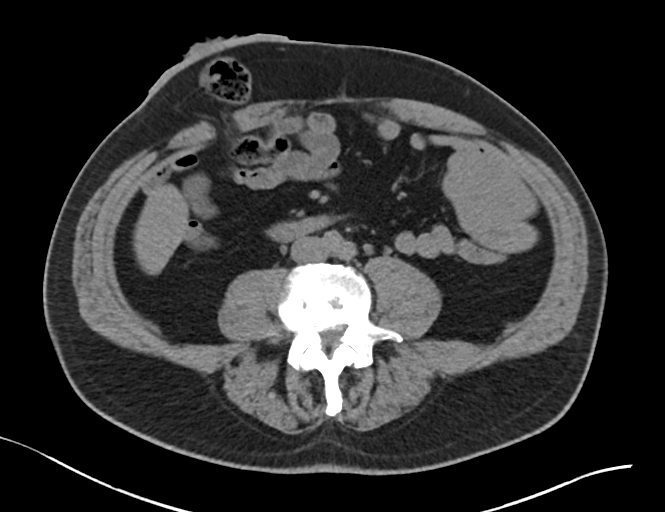
[im 67/94  soft-tissue]
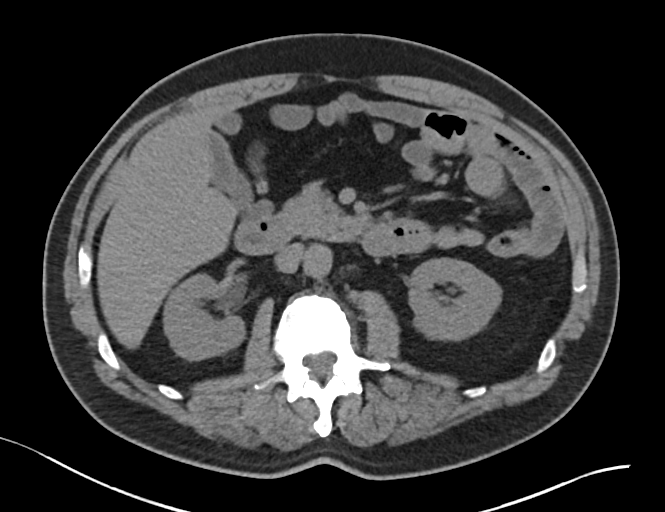
[im 79/94  soft-tissue]
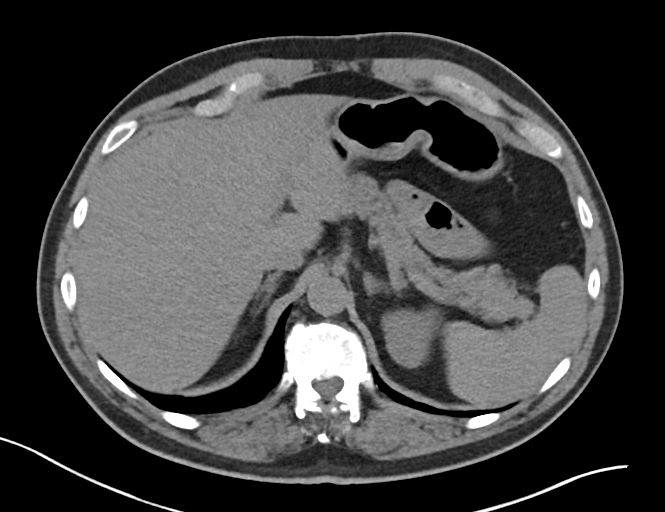
[im 86/94  soft-tissue]
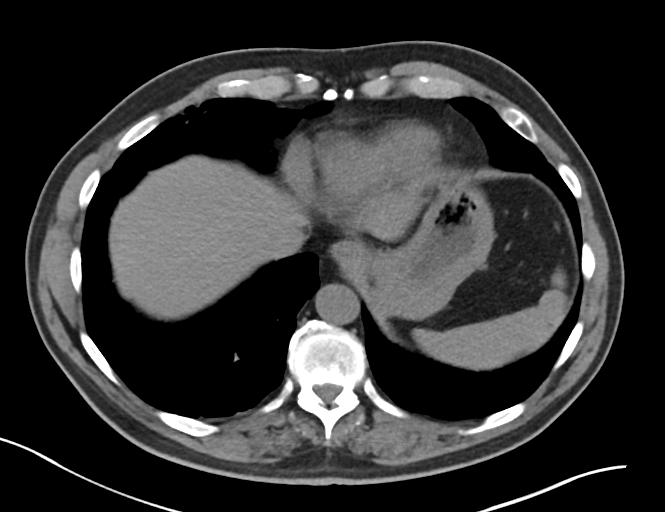
[im 86/94  bone]
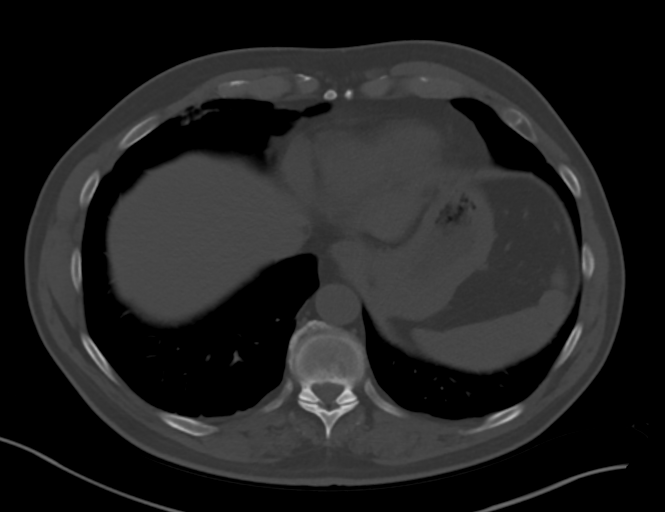

[Series 6: renal stone 2.00 br40 s3 cor · coronal · 0.77mm/px · 3 of 156 slices shown]
[im 52/156  soft-tissue]
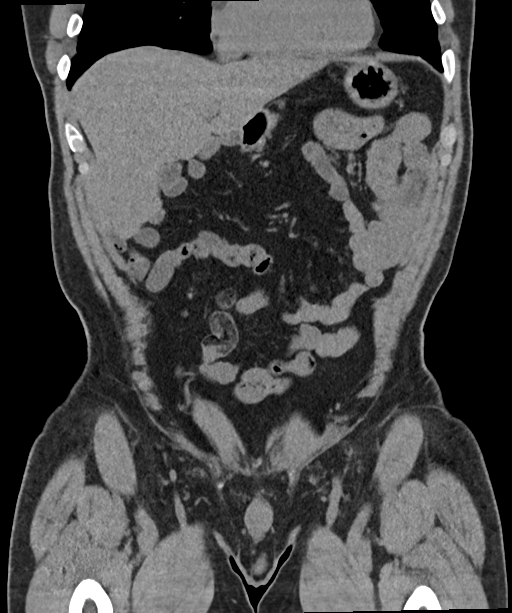
[im 69/156  soft-tissue]
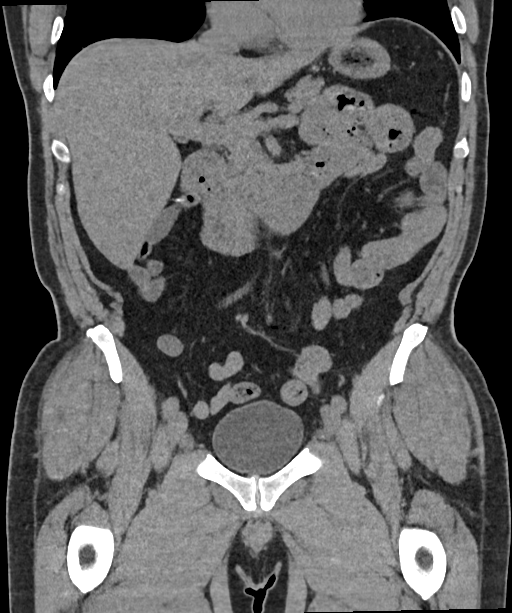
[im 87/156  soft-tissue]
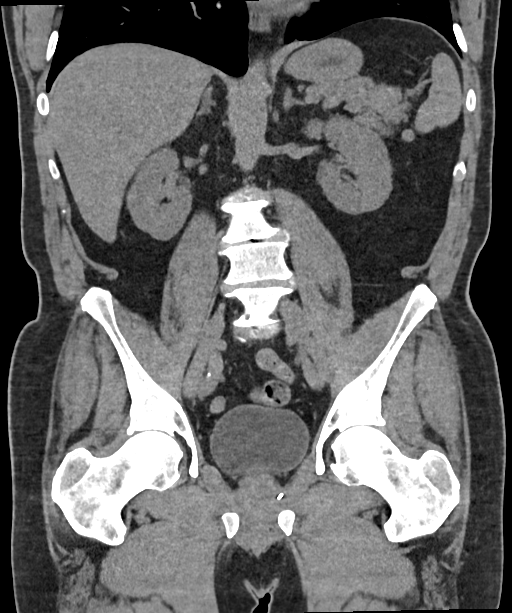

[12 of 46 positions shown; findings below may reference images not displayed]

FINDINGS: Lower chest: Mild ground-glass and bandlike changes in the anterior
RIGHT middle lobe. No lobar consolidation. No sign of pleural
effusion.

Hepatobiliary: Liver with smooth contours. No pericholecystic
stranding. No gross biliary duct distension. Assessment limited by
lack of contrast.

Pancreas: No peripancreatic stranding.  Normal pancreatic contours.

Spleen: Normal in size and contour.

Adrenals/Urinary Tract: Adrenal glands are normal.

Smooth renal contours. No hydronephrosis. No perinephric stranding.
No nephrolithiasis or ureteral calculi. Urinary bladder without
adjacent stranding. Prostate is enlarged with lobulation extending
into the bladder base likely median lobe hypertrophy extending into
the urinary bladder.

Stomach/Bowel: No acute gastrointestinal findings. Post colectomy
and proctectomy with RIGHT lower quadrant ileostomy.

Vascular/Lymphatic:

Aortic atherosclerosis. No sign of aneurysm. Smooth contour of the
IVC. There is no gastrohepatic or hepatoduodenal ligament
lymphadenopathy. No retroperitoneal or mesenteric lymphadenopathy.

No pelvic sidewall lymphadenopathy.

Vascular assessment limited by lack of intravenous contrast.
Atherosclerotic changes are mild.

Reproductive: Prostatomegaly is moderate. Calcifications in the
prostate gland. Median lobe hypertrophy extends into the bladder
base.

Other: Small fat containing umbilical hernia. Suspect bilateral
hydroceles in the scrotum.

Musculoskeletal: Spinal degenerative changes. No acute or
destructive bone finding. Degenerative changes are moderate to
marked and worse in the lower lumbar spine
IMPRESSION: 1. No nephrolithiasis or ureteral calculi.
2. Post colectomy and proctectomy with RIGHT lower quadrant
ileostomy.
3. Prostatomegaly with lobulation extending into the bladder base
which is likely median lobe hypertrophy. In the setting of gross
hematuria cystoscopy and urine cytology may be warranted on
follow-up.
4. Potential small bilateral hydroceles in the scrotum.
5. Aortic atherosclerosis.

Aortic Atherosclerosis ([id number]).

## 2022-03-12 ENCOUNTER — Other Ambulatory Visit: Payer: Self-pay | Admitting: Physician Assistant

## 2022-03-28 ENCOUNTER — Other Ambulatory Visit: Payer: Self-pay | Admitting: Physician Assistant

## 2022-03-29 ENCOUNTER — Other Ambulatory Visit: Payer: Self-pay | Admitting: Family Medicine

## 2022-03-29 MED ORDER — ROSUVASTATIN CALCIUM 10 MG PO TABS: 10.0000 mg | ORAL_TABLET | Freq: Every day | ORAL | 1 refills | Status: DC

## 2022-05-27 ENCOUNTER — Other Ambulatory Visit: Payer: Self-pay | Admitting: Urology

## 2022-07-01 NOTE — Assessment & Plan Note (Signed)
Has colostomy 

## 2022-07-01 NOTE — Assessment & Plan Note (Signed)
The current medical regimen is effective;  continue present plan and medications.  

## 2022-07-01 NOTE — Assessment & Plan Note (Signed)
Well controlled.  ?No changes to medicines.  ?Continue to work on eating a healthy diet and exercise.  ?Labs drawn today.  ?

## 2022-07-01 NOTE — Assessment & Plan Note (Signed)
Recommend continue to work on eating healthy diet and exercise. The current medical regimen is effective;  continue present plan and medications.  

## 2022-07-01 NOTE — Progress Notes (Deleted)
Subjective:  Patient ID: Tyler Giles, male    DOB: 01-28-1951  Age: 71 y.o. MRN: [id number]  No chief complaint on file.   HPI  Hyperlipidemia/aortic atherosclerosis:  Patient is currently taking rosuvastatin 5 mg daily.  Aspirin 81 mg daily. Patient eats healthy.  Patient takes coenzyme every 10.  Takes fish oil 1000 mg 1 p.o. daily.  Obstructive Sleep Apnea: On cpap.   BPH: Currently on tamsulosin 0.4 mg Returned to urology had ct scan with contrast. Has a very enlarged prostate.   Prediabetes: Currently on metformin XR 500 mg once daily   Eats healthy and exercises. Has an colostomy. No problems with it.  Current Outpatient Medications on File Prior to Visit  Medication Sig Dispense Refill   aspirin 81 MG EC tablet Take by mouth.     augmented betamethasone dipropionate (DIPROLENE-AF) 0.05 % cream Apply topically.     celecoxib (CELEBREX) 200 MG capsule Take 200 mg by mouth 2 (two) times daily.     Cholecalciferol 100 MCG (4000 UT) CAPS Take by mouth.     clobetasol cream (TEMOVATE) 0.05 % Apply 1 application. topically daily.     Coenzyme Q10 (COQ10) 150 MG CAPS Take by mouth.     finasteride (PROSCAR) 5 MG tablet TAKE 1 TABLET BY MOUTH ONCE DAILY 90 tablet 1   fluorouracil (EFUDEX) 5 % cream Apply topically.     metFORMIN (GLUCOPHAGE-XR) 500 MG 24 hr tablet TAKE 1 TABLET BY MOUTH TWICE A DAY 180 tablet 3   Multiple Vitamin (MULTIVITAMIN) capsule Take by mouth.     Omega-3 Fatty Acids (FISH OIL) 1000 MG CAPS Take 1 capsule by mouth daily.     rosuvastatin (CRESTOR) 10 MG tablet Take 1 tablet (10 mg total) by mouth daily. 90 tablet 1   tamsulosin (FLOMAX) 0.4 MG CAPS capsule Take by mouth.     zinc gluconate 50 MG tablet Take by mouth.     No current facility-administered medications on file prior to visit.   Past Medical History:  Diagnosis Date   Impaired fasting glucose    Induration penis plastica    Mixed hyperlipidemia    Obstructive sleep apnea     Unilateral primary osteoarthritis, left knee    Past Surgical History:  Procedure Laterality Date   APPENDECTOMY     HERNIA REPAIR  2002   x2   ILEOSTOMY     Left knee arthroscopy     Left partial Knee replacement     TOTAL KNEE ARTHROPLASTY Left 09/2020    Family History  Problem Relation Age of Onset   Cerebrovascular Accident Mother    Acute myelogenous leukemia Father    Prostate cancer Father    Coronary artery disease Maternal Uncle    Coronary artery disease Maternal Grandmother    Stroke Paternal Grandmother    Stroke Paternal Grandfather    Social History   Socioeconomic History   Marital status: Married    Spouse name: Not on file   Number of children: 2   Years of education: Not on file   Highest education level: Not on file  Occupational History   Not on file  Tobacco Use   Smoking status: Never   Smokeless tobacco: Never  Vaping Use   Vaping Use: Never used  Substance and Sexual Activity   Alcohol use: Yes    Alcohol/week: 5.0 standard drinks of alcohol    Types: 5 Glasses of wine per week    Comment: Patient drinks  socially and typically consumes wine.   Drug use: Never   Sexual activity: Yes  Other Topics Concern   Not on file  Social History Narrative   Not on file   Social Determinants of Health   Financial Resource Strain: Low Risk  (07/01/2021)   Overall Financial Resource Strain (CARDIA)    Difficulty of Paying Living Expenses: Not hard at all  Food Insecurity: No Food Insecurity (07/01/2021)   Hunger Vital Sign    Worried About Running Out of Food in the Last Year: Never true    Ran Out of Food in the Last Year: Never true  Transportation Needs: No Transportation Needs (07/01/2021)   PRAPARE - Hydrologist (Medical): No    Lack of Transportation (Non-Medical): No  Physical Activity: Sufficiently Active (07/01/2021)   Exercise Vital Sign    Days of Exercise per Week: 3 days    Minutes of Exercise per  Session: 60 min  Stress: No Stress Concern Present (07/01/2021)   [place]    Feeling of Stress : Not at all  Social Connections: [place] (07/01/2021)   Social Connection and Isolation Panel [NHANES]    Frequency of Communication with Friends and Family: More than three times a week    Frequency of Social Gatherings with Friends and Family: Once a week    Attends Religious Services: More than 4 times per year    Active Member of [employer] or Organizations: Yes    Attends Music therapist: More than 4 times per year    Marital Status: Married    Review of Systems  Constitutional:  Negative for chills, fatigue and fever.  HENT:  Negative for congestion, ear pain and sore throat.   Respiratory:  Negative for cough and shortness of breath.   Cardiovascular:  Negative for chest pain.  Gastrointestinal:  Negative for abdominal pain, constipation, diarrhea, nausea and vomiting.  Endocrine: Negative for polydipsia, polyphagia and polyuria.  Genitourinary:  Negative for dysuria and frequency.  Musculoskeletal:  Negative for arthralgias and myalgias.  Neurological:  Negative for dizziness and headaches.  Psychiatric/Behavioral:  Negative for dysphoric mood.        No dysphoria     Objective:  There were no vitals taken for this visit.     12/30/2021    8:31 AM 11/06/2021    7:53 AM 10/02/2021    8:52 AM  BP/Weight  Systolic BP 563 149 702  Diastolic BP 84 64 78  Wt. (Lbs) 191 192.8 199  BMI 28.62 kg/m2 28.89 kg/m2 29.82 kg/m2    Physical Exam Vitals reviewed.  Constitutional:      Appearance: Normal appearance.  Neck:     Vascular: No carotid bruit.  Cardiovascular:     Rate and Rhythm: Normal rate and regular rhythm.     Pulses: Normal pulses.     Heart sounds: Normal heart sounds.  Pulmonary:     Effort: Pulmonary effort is normal.     Breath sounds: Normal breath sounds. No wheezing,  rhonchi or rales.  Abdominal:     General: Bowel sounds are normal.     Palpations: Abdomen is soft.     Tenderness: There is no abdominal tenderness.  Neurological:     Mental Status: He is alert.  Psychiatric:        Mood and Affect: Mood normal.        Behavior: Behavior normal.  Diabetic Foot Exam - Simple   No data filed      Lab Results  Component Value Date   WBC 4.8 12/30/2021   HGB 15.8 12/30/2021   HCT 47.0 12/30/2021   PLT 206 12/30/2021   GLUCOSE 97 12/30/2021   CHOL 178 12/30/2021   TRIG 90 12/30/2021   HDL 60 12/30/2021   LDLCALC 102 (H) 12/30/2021   ALT 15 12/30/2021   AST 21 12/30/2021   NA 140 12/30/2021   K 4.8 12/30/2021   CL 103 12/30/2021   CREATININE 1.07 12/30/2021   BUN 13 12/30/2021   CO2 22 12/30/2021   TSH 1.420 07/01/2021   HGBA1C 5.6 12/30/2021      Assessment & Plan:   Problem List Items Addressed This Visit   None .  No orders of the defined types were placed in this encounter.   No orders of the defined types were placed in this encounter.    Follow-up: No follow-ups on file.  An After Visit Summary was printed and given to the patient.  Blane Ohara, MD [facility] [phone]

## 2022-07-01 NOTE — Assessment & Plan Note (Signed)
Stable. Continue cpap. 

## 2022-07-02 ENCOUNTER — Telehealth: Payer: Self-pay

## 2022-07-02 NOTE — Telephone Encounter (Signed)
Error

## 2022-07-04 ENCOUNTER — Telehealth: Payer: Self-pay

## 2022-07-04 NOTE — Telephone Encounter (Signed)
I left a message on the number(s) listed in the patients chart requesting the patient to call back regarding the upcomming appointment for 07/05/2022. The provider is out of the office that day. The appointment has been canceled. Waiting for the patient to return the call.   

## 2022-07-05 ENCOUNTER — Encounter: Payer: Medicare Other | Admitting: Family Medicine

## 2022-07-05 DIAGNOSIS — K5180 Other ulcerative colitis without complications: Secondary | ICD-10-CM

## 2022-07-05 DIAGNOSIS — E782 Mixed hyperlipidemia: Secondary | ICD-10-CM

## 2022-07-05 DIAGNOSIS — N400 Enlarged prostate without lower urinary tract symptoms: Secondary | ICD-10-CM

## 2022-07-05 DIAGNOSIS — I700 Atherosclerosis of aorta: Secondary | ICD-10-CM

## 2022-07-05 DIAGNOSIS — G4733 Obstructive sleep apnea (adult) (pediatric): Secondary | ICD-10-CM

## 2022-07-05 DIAGNOSIS — R7301 Impaired fasting glucose: Secondary | ICD-10-CM

## 2022-07-09 NOTE — Progress Notes (Unsigned)
Subjective:   Tyler Giles is a 71 y.o. male who presents for Medicare Annual/Subsequent preventive examination.  Hyperlipidemia/aortic atherosclerosis:  Patient is currently taking rosuvastatin 10 mg daily.  Aspirin 81 mg daily. Patient eats healthy.  Patient takes coenzyme every 10.  Takes fish oil 1000 mg 1 p.o. daily.  Obstructive Sleep Apnea: On cpap.   BPH: Currently on tamsulosin 0.4 mg Returned to urology had ct scan with contrast. Has a very enlarged prostate.   Prediabetes: Currently on metformin XR 500 mg once daily   Eats healthy and exercises. Has an colostomy. No problems with it.   Review of Systems    Review of Systems  Constitutional:  Negative for chills and fever.  HENT:  Negative for congestion, ear pain and sore throat.   Respiratory:  Negative for cough and shortness of breath.   Cardiovascular:  Negative for chest pain.  Gastrointestinal:  Negative for abdominal pain, constipation, diarrhea, heartburn, nausea and vomiting.  Genitourinary:  Negative for dysuria and frequency.       Decreased urine flow   Musculoskeletal:  Positive for neck pain. Negative for back pain and myalgias.  Neurological:  Negative for dizziness and headaches.          Objective:    Today's Vitals   07/14/22 1723  BP: 128/84  Pulse: 88  Resp: 16  Temp: (!) 97.1 F (36.2 C)  Weight: 195 lb (88.5 kg)  Height: 5' 8.5" (1.74 m)   Body mass index is 29.22 kg/m.     07/01/2021    8:25 AM 06/29/2020    8:16 PM  Advanced Directives  Does Patient Have a Medical Advance Directive? Yes Yes  Type of Paramedic of [place];Living will [place]  Does patient want to make changes to medical advance directive? No - Patient declined     Current Medications (verified) Outpatient Encounter Medications as of 07/14/2022  Medication Sig   aspirin 81 MG EC tablet Take by mouth.   celecoxib (CELEBREX) 200 MG capsule Take 1 capsule  (200 mg total) by mouth daily.   Cholecalciferol 100 MCG (4000 UT) CAPS Take by mouth.   Coenzyme Q10 (COQ10) 150 MG CAPS Take by mouth.   finasteride (PROSCAR) 5 MG tablet TAKE 1 TABLET BY MOUTH ONCE DAILY   metFORMIN (GLUCOPHAGE-XR) 500 MG 24 hr tablet TAKE 1 TABLET BY MOUTH TWICE A DAY   Multiple Vitamin (MULTIVITAMIN) capsule Take by mouth.   Omega-3 Fatty Acids (FISH OIL) 1000 MG CAPS Take 1 capsule by mouth daily.   rosuvastatin (CRESTOR) 10 MG tablet Take 1 tablet (10 mg total) by mouth daily.   zinc gluconate 50 MG tablet Take by mouth.   [DISCONTINUED] augmented betamethasone dipropionate (DIPROLENE-AF) 0.05 % cream Apply topically.   [DISCONTINUED] celecoxib (CELEBREX) 200 MG capsule Take 200 mg by mouth 2 (two) times daily.   [DISCONTINUED] clobetasol cream (TEMOVATE) 4.88 % Apply 1 application. topically daily.   [DISCONTINUED] fluorouracil (EFUDEX) 5 % cream Apply topically.   [DISCONTINUED] tamsulosin (FLOMAX) 0.4 MG CAPS capsule Take by mouth.   No facility-administered encounter medications on file as of 07/14/2022.    Allergies (verified) Atorvastatin   History: Past Medical History:  Diagnosis Date   Impaired fasting glucose    Induration penis plastica    Mixed hyperlipidemia    Obstructive sleep apnea    Unilateral primary osteoarthritis, left knee    Past Surgical History:  Procedure Laterality Date   APPENDECTOMY  HERNIA REPAIR  2002   x2   ILEOSTOMY     Left knee arthroscopy     Left partial Knee replacement     TOTAL KNEE ARTHROPLASTY Left 09/2020   Family History  Problem Relation Age of Onset   Cerebrovascular Accident Mother    Acute myelogenous leukemia Father    Prostate cancer Father    Coronary artery disease Maternal Uncle    Coronary artery disease Maternal Grandmother    Stroke Paternal Grandmother    Stroke Paternal Grandfather    Social History   Socioeconomic History   Marital status: Married    Spouse name: Not on file    Number of children: 2   Years of education: Not on file   Highest education level: Not on file  Occupational History   Not on file  Tobacco Use   Smoking status: Never   Smokeless tobacco: Never  Vaping Use   Vaping Use: Never used  Substance and Sexual Activity   Alcohol use: Yes    Alcohol/week: 5.0 standard drinks of alcohol    Types: 5 Glasses of wine per week    Comment: Patient drinks socially and typically consumes wine.   Drug use: Never   Sexual activity: Yes  Other Topics Concern   Not on file  Social History Narrative   Not on file   Social Determinants of Health   Financial Resource Strain: Low Risk  (07/04/2022)   Overall Financial Resource Strain (CARDIA)    Difficulty of Paying Living Expenses: Not hard at all  Food Insecurity: No Food Insecurity (07/04/2022)   Hunger Vital Sign    Worried About Running Out of Food in the Last Year: Never true    Ran Out of Food in the Last Year: Never true  Transportation Needs: No Transportation Needs (07/04/2022)   PRAPARE - Administrator, Civil Service (Medical): No    Lack of Transportation (Non-Medical): No  Physical Activity: Sufficiently Active (07/14/2022)   Exercise Vital Sign    Days of Exercise per Week: 4 days    Minutes of Exercise per Session: 40 min  Recent Concern: Physical Activity - Insufficiently Active (07/04/2022)   Exercise Vital Sign    Days of Exercise per Week: 6 days    Minutes of Exercise per Session: 20 min  Stress: No Stress Concern Present (07/14/2022)   [employer] of Occupational Health - Occupational Stress Questionnaire    Feeling of Stress : Only a little  Social Connections: Unknown (07/04/2022)   Social Connection and Isolation Panel [NHANES]    Frequency of Communication with Friends and Family: More than three times a week    Frequency of Social Gatherings with Friends and Family: More than three times a week    Attends Religious Services: Not on Administrator, sports or Organizations: Yes    Attends Banker Meetings: More than 4 times per year    Marital Status: Married    Tobacco Counseling Counseling given: Not Answered   Clinical Intake:    How often do you need to have someone help you when you read instructions, pamphlets, or other written materials from your doctor or pharmacy?: (P) 1 - Never  Activities of Daily Living    07/04/2022    3:33 PM  In your present state of health, do you have any difficulty performing the following activities:  Hearing? 0   0  Vision? 0   0  Difficulty concentrating or making decisions? 0   0  Walking or climbing stairs? 0   0  Dressing or bathing? 0   0  Doing errands, shopping? 0   0  Preparing Food and eating ? N   N  Using the Toilet? N   N  In the past six months, have you accidently leaked urine? N   N  Do you have problems with loss of bowel control? N   N  Managing your Medications? N   N  Managing your Finances? N   N  Housekeeping or managing your Housekeeping? N   N    Patient Care Team: CoxFritzi Mandes, MD as PCP - General (Family Medicine)  Indicate any recent Medical Services you may have received from other than [facility] providers in the past year (date may be approximate).     Assessment:   This is a routine wellness examination for Donavyn.  Hearing/Vision screen No results found.  Dietary issues and exercise activities discussed:     Goals Addressed   None   Depression Screen    07/14/2022   11:18 AM 07/01/2021    8:21 AM 06/29/2020    8:13 PM 12/26/2019    9:13 AM  PHQ 2/9 Scores  PHQ - 2 Score 0 0 0 0    Fall Risk    07/04/2022    3:33 PM 07/01/2021    8:21 AM 06/29/2020    8:13 PM  Fall Risk   Falls in the past year? 0   0 0 0  Number falls in past yr: 0   0 0 0  Injury with Fall? 0   0 0 0  Risk for fall due to :  No Fall Risks No Fall Risks  Follow up   Falls evaluation completed;Falls prevention discussed     FALL RISK PREVENTION PERTAINING TO THE HOME:  Any stairs in or around the home? Yes  If so, are there any without handrails? Yes  Home free of loose throw rugs in walkways, pet beds, electrical cords, etc? Yes  Adequate lighting in your home to reduce risk of falls? Yes   ASSISTIVE DEVICES UTILIZED TO PREVENT FALLS:  Life alert? No  Use of a cane, walker or w/c? No  Grab bars in the bathroom? No  Shower chair or bench in shower? Yes  Elevated toilet seat or a handicapped toilet? No   TIMED UP AND GO:  Was the test performed? {YES/NO:21197}.  Length of time to ambulate 10 feet: *** sec.   {Appearance of HALP:3790240}  Cognitive Function:        07/01/2021    8:25 AM  6CIT Screen  What Year? 0 points  What month? 0 points  What time? 0 points  Count back from 20 0 points  Months in reverse 0 points  Repeat phrase 0 points  Total Score 0 points    Immunizations Immunization History  Administered Date(s) Administered   BCG 02/21/2020   Fluad Quad(high Dose 65+) 06/26/2020, 07/14/2022   Influenza-Unspecified 06/28/2019, 06/03/2021   PFIZER(Purple Top)SARS-COV-2 Vaccination 09/22/2019, 10/13/2019, 06/12/2020   PNEUMOCOCCAL CONJUGATE-20 12/31/2021   PPD Test 02/19/2020   Pneumococcal Conjugate-13 05/27/2015   Pneumococcal Polysaccharide-23 07/01/2010, 08/18/2016   Tdap 03/25/2014, 07/05/2018   Zoster Recombinat (Shingrix) 03/10/2021, 06/23/2021   Zoster, Live 04/03/2013   Screening Tests Health Maintenance  Topic Date Due   Hepatitis C Screening  Never done   COVID-19 Vaccine (4 - Pfizer series) 08/07/2020  Medicare Annual Wellness (AWV)  07/15/2023   TETANUS/TDAP  07/05/2028   Pneumonia Vaccine 30+ Years old  Completed   INFLUENZA VACCINE  Completed   Zoster Vaccines- Shingrix  Completed   HPV VACCINES  Aged Out   COLONOSCOPY (Pts 45-32yrs Insurance coverage will need to be confirmed)  Discontinued    Health Maintenance  Health Maintenance Due   Topic Date Due   Hepatitis C Screening  Never done   COVID-19 Vaccine (4 - Pfizer series) 08/07/2020   Additional Screening:  Hepatitis C Screening: DUE  Vision Screening: Recommended annual ophthalmology exams for early detection of glaucoma and other disorders of the eye. Is the patient up to date with their annual eye exam?   Dental Screening: Recommended annual dental exams for proper oral hygiene      Plan:    Problem List Items Addressed This Visit       Respiratory   OSA (obstructive sleep apnea)     Endocrine   Impaired fasting glucose   Relevant Orders   Hemoglobin A1c     Nervous and Auditory   Bilateral impacted cerumen     Genitourinary   Benign prostatic hyperplasia without lower urinary tract symptoms     Other   Hyperlipidemia   Relevant Orders   CBC with Differential/Platelet   Comprehensive metabolic panel   Lipid panel   TSH   Encounter for Medicare annual wellness exam - Primary   Encounter for hepatitis C screening test for low risk patient   Relevant Orders   HCV Ab w Reflex to Quant PCR   Need for influenza vaccination   Relevant Orders   Flu Vaccine QUAD High Dose(Fluad) (Completed)   Colostomy status (HCC)     I have personally reviewed and noted the following in the patient's chart:   Medical and social history Use of alcohol, tobacco or illicit drugs  Current medications and supplements including opioid prescriptions. Patient is not currently taking opioid prescriptions. Functional ability and status Nutritional status Physical activity Advanced directives List of other physicians Hospitalizations, surgeries, and ER visits in previous 12 months Vitals Screenings to include cognitive, depression, and falls Referrals and appointments  In addition, I have reviewed and discussed with patient certain preventive protocols, quality metrics, and best practice recommendations. A written personalized care plan for preventive  services as well as general preventive health recommendations were provided to patient.   Blane Ohara, MD   07/14/2022

## 2022-07-09 NOTE — Patient Instructions (Signed)
  Mr. Tyler Giles , Thank you for taking time to come for your Medicare Wellness Visit. I appreciate your ongoing commitment to your health goals. Please review the following plan we discussed and let me know if I can assist you in the future.   Recommend debrox for ears otc. Return in one week for ear irrigation.    This is a list of the screening recommended for you and due dates:  Health Maintenance  Topic Date Due   Hepatitis C Screening: USPSTF Recommendation to screen - Ages 61-79 yo.  Never done   COVID-19 Vaccine (4 - Pfizer series) 08/07/2020   Flu Shot  04/06/2022   Medicare Annual Wellness Visit  07/15/2023   Tetanus Vaccine  07/05/2028   Pneumonia Vaccine  Completed   Zoster (Shingles) Vaccine  Completed   HPV Vaccine  Aged Out   Colon Cancer Screening  Discontinued

## 2022-07-09 NOTE — Progress Notes (Unsigned)
This encounter was created in error - please disregard.

## 2022-07-14 ENCOUNTER — Ambulatory Visit (INDEPENDENT_AMBULATORY_CARE_PROVIDER_SITE_OTHER): Payer: Medicare Other | Admitting: Family Medicine

## 2022-07-14 VITALS — BP 128/84 | HR 88 | Temp 97.1°F | Resp 16 | Ht 68.5 in | Wt 195.0 lb

## 2022-07-14 DIAGNOSIS — H6123 Impacted cerumen, bilateral: Secondary | ICD-10-CM | POA: Insufficient documentation

## 2022-07-14 DIAGNOSIS — Z1159 Encounter for screening for other viral diseases: Secondary | ICD-10-CM | POA: Insufficient documentation

## 2022-07-14 DIAGNOSIS — R7301 Impaired fasting glucose: Secondary | ICD-10-CM | POA: Diagnosis not present

## 2022-07-14 DIAGNOSIS — E782 Mixed hyperlipidemia: Secondary | ICD-10-CM

## 2022-07-14 DIAGNOSIS — Z23 Encounter for immunization: Secondary | ICD-10-CM | POA: Insufficient documentation

## 2022-07-14 DIAGNOSIS — N400 Enlarged prostate without lower urinary tract symptoms: Secondary | ICD-10-CM | POA: Diagnosis not present

## 2022-07-14 DIAGNOSIS — G4733 Obstructive sleep apnea (adult) (pediatric): Secondary | ICD-10-CM

## 2022-07-14 DIAGNOSIS — Z0000 Encounter for general adult medical examination without abnormal findings: Secondary | ICD-10-CM | POA: Diagnosis not present

## 2022-07-14 DIAGNOSIS — Z933 Colostomy status: Secondary | ICD-10-CM | POA: Insufficient documentation

## 2022-07-14 MED ORDER — CELECOXIB 200 MG PO CAPS: 200.0000 mg | ORAL_CAPSULE | Freq: Every day | ORAL | 0 refills | Status: DC

## 2022-07-15 LAB — COMPREHENSIVE METABOLIC PANEL
ALT: 15 IU/L (ref 0–44)
AST: 21 IU/L (ref 0–40)
Albumin/Globulin Ratio: 2.2 (ref 1.2–2.2)
Albumin: 4.6 g/dL (ref 3.8–4.8)
Alkaline Phosphatase: 78 IU/L (ref 44–121)
BUN/Creatinine Ratio: 16 (ref 10–24)
BUN: 17 mg/dL (ref 8–27)
Bilirubin Total: 0.6 mg/dL (ref 0.0–1.2)
CO2: 22 mmol/L (ref 20–29)
Calcium: 9.6 mg/dL (ref 8.6–10.2)
Chloride: 101 mmol/L (ref 96–106)
Creatinine, Ser: 1.04 mg/dL (ref 0.76–1.27)
Globulin, Total: 2.1 g/dL (ref 1.5–4.5)
Glucose: 93 mg/dL (ref 70–99)
Potassium: 4.5 mmol/L (ref 3.5–5.2)
Sodium: 138 mmol/L (ref 134–144)
Total Protein: 6.7 g/dL (ref 6.0–8.5)
eGFR: 77 mL/min/{1.73_m2} (ref >59)

## 2022-07-15 LAB — CBC WITH DIFFERENTIAL/PLATELET
Basophils Absolute: 0 10*3/uL (ref 0.0–0.2)
Basos: 1 %
EOS (ABSOLUTE): 0.1 10*3/uL (ref 0.0–0.4)
Eos: 1 %
Hematocrit: 47.5 % (ref 37.5–51.0)
Hemoglobin: 15.4 g/dL (ref 13.0–17.7)
Immature Grans (Abs): 0 10*3/uL (ref 0.0–0.1)
Immature Granulocytes: 0 %
Lymphocytes Absolute: 0.7 10*3/uL (ref 0.7–3.1)
Lymphs: 15 %
MCH: 29.7 pg (ref 26.6–33.0)
MCHC: 32.4 g/dL (ref 31.5–35.7)
MCV: 92 fL (ref 79–97)
Monocytes Absolute: 0.3 10*3/uL (ref 0.1–0.9)
Monocytes: 7 %
Neutrophils Absolute: 3.8 10*3/uL (ref 1.4–7.0)
Neutrophils: 76 %
Platelets: 197 10*3/uL (ref 150–450)
RBC: 5.18 x10E6/uL (ref 4.14–5.80)
RDW: 13.8 % (ref 11.6–15.4)
WBC: 4.9 10*3/uL (ref 3.4–10.8)

## 2022-07-15 LAB — HCV AB W REFLEX TO QUANT PCR: HCV Ab: NONREACTIVE

## 2022-07-15 LAB — TSH: TSH: 1.5 u[IU]/mL (ref 0.450–4.500)

## 2022-07-15 LAB — LIPID PANEL
Chol/HDL Ratio: 2.7 ratio (ref 0.0–5.0)
Cholesterol, Total: 163 mg/dL (ref 100–199)
HDL: 60 mg/dL (ref >39)
LDL Chol Calc (NIH): 85 mg/dL (ref 0–99)
Triglycerides: 96 mg/dL (ref 0–149)
VLDL Cholesterol Cal: 18 mg/dL (ref 5–40)

## 2022-07-15 LAB — CARDIOVASCULAR RISK ASSESSMENT

## 2022-07-15 LAB — HEMOGLOBIN A1C
Est. average glucose Bld gHb Est-mCnc: 114 mg/dL
Hgb A1c MFr Bld: 5.6 % (ref 4.8–5.6)

## 2022-07-15 LAB — HCV INTERPRETATION

## 2022-07-15 NOTE — Assessment & Plan Note (Signed)
For years.

## 2022-07-15 NOTE — Assessment & Plan Note (Signed)
Compliant with cpap. Benefits from CPAP.

## 2022-07-15 NOTE — Assessment & Plan Note (Signed)
The current medical regimen is effective;  continue present plan and medications.  

## 2022-07-15 NOTE — Assessment & Plan Note (Signed)
Check hep c ?

## 2022-07-15 NOTE — Assessment & Plan Note (Signed)
Recommend debrox for ears otc. Return in one week for ear irrigation.

## 2022-07-15 NOTE — Assessment & Plan Note (Signed)
Recommend continue to work on eating healthy diet and exercise. Check A1c 

## 2022-07-15 NOTE — Assessment & Plan Note (Signed)
Well controlled.  ?No changes to medicines.  ?Continue to work on eating a healthy diet and exercise.  ?Labs drawn today.  ?

## 2022-07-15 NOTE — Assessment & Plan Note (Signed)
Education given. 

## 2022-07-26 ENCOUNTER — Encounter (HOSPITAL_BASED_OUTPATIENT_CLINIC_OR_DEPARTMENT_OTHER): Payer: Self-pay | Admitting: Urology

## 2022-07-27 ENCOUNTER — Encounter (HOSPITAL_BASED_OUTPATIENT_CLINIC_OR_DEPARTMENT_OTHER): Payer: Self-pay | Admitting: Urology

## 2022-07-28 ENCOUNTER — Encounter (HOSPITAL_BASED_OUTPATIENT_CLINIC_OR_DEPARTMENT_OTHER): Payer: Self-pay | Admitting: Urology

## 2022-07-28 NOTE — Progress Notes (Addendum)
Spoke w/ via phone for pre-op interview--- pt Lab needs dos---- no              Lab results------ pt has current lab results in epic dated 07-14-2022 CBCdiff/ CMP COVID test -----patient states asymptomatic no test needed Arrive at ------- 1000  on 08-02-2022 NPO after MN NO Solid Food. Clear liquids from MN until--- 0900 Med rec completed Medications to take morning of surgery -----  none Diabetic medication ----- do not take metformin morning of surgery Patient instructed no nail polish to be worn day of surgery Patient instructed to bring photo id and insurance card day of surgery Patient aware to have Driver (ride ) / caregiver  for 24 hours after surgery -- wife, susan Patient Special Instructions ----- reviewed [facility] and visitor guidelines.  Asked pt to bring ileostomy supplies/ cpap, mask, tubing dos Pre-Op special Istructions -----  per pt was instructions to stop asa by dr gay office,  last dose 07-19-2022 Patient verbalized understanding of instructions that were given at this phone interview. Patient denies shortness of breath, chest pain, fever, cough at this phone interview.

## 2022-08-02 ENCOUNTER — Encounter (HOSPITAL_BASED_OUTPATIENT_CLINIC_OR_DEPARTMENT_OTHER): Payer: Self-pay | Admitting: Urology

## 2022-08-02 ENCOUNTER — Ambulatory Visit (HOSPITAL_BASED_OUTPATIENT_CLINIC_OR_DEPARTMENT_OTHER)
Admission: RE | Admit: 2022-08-02 | Discharge: 2022-08-03 | Disposition: A | Discharge status: Home / Self Care | Payer: Medicare Other | Attending: Urology | Admitting: Urology

## 2022-08-02 ENCOUNTER — Ambulatory Visit (HOSPITAL_BASED_OUTPATIENT_CLINIC_OR_DEPARTMENT_OTHER): Payer: Medicare Other | Admitting: Anesthesiology

## 2022-08-02 ENCOUNTER — Encounter (HOSPITAL_BASED_OUTPATIENT_CLINIC_OR_DEPARTMENT_OTHER)
Admission: RE | Disposition: A | Discharge status: Home / Self Care | Payer: Self-pay | Source: Home / Self Care | Attending: Urology

## 2022-08-02 DIAGNOSIS — G4730 Sleep apnea, unspecified: Secondary | ICD-10-CM | POA: Insufficient documentation

## 2022-08-02 DIAGNOSIS — Z01818 Encounter for other preprocedural examination: Secondary | ICD-10-CM

## 2022-08-02 DIAGNOSIS — Z8042 Family history of malignant neoplasm of prostate: Secondary | ICD-10-CM | POA: Insufficient documentation

## 2022-08-02 DIAGNOSIS — N138 Other obstructive and reflux uropathy: Secondary | ICD-10-CM | POA: Diagnosis not present

## 2022-08-02 DIAGNOSIS — N400 Enlarged prostate without lower urinary tract symptoms: Secondary | ICD-10-CM

## 2022-08-02 DIAGNOSIS — R310 Gross hematuria: Secondary | ICD-10-CM | POA: Insufficient documentation

## 2022-08-02 DIAGNOSIS — R3912 Poor urinary stream: Secondary | ICD-10-CM | POA: Insufficient documentation

## 2022-08-02 DIAGNOSIS — K5190 Ulcerative colitis, unspecified, without complications: Secondary | ICD-10-CM | POA: Insufficient documentation

## 2022-08-02 DIAGNOSIS — Z9049 Acquired absence of other specified parts of digestive tract: Secondary | ICD-10-CM | POA: Insufficient documentation

## 2022-08-02 DIAGNOSIS — Z932 Ileostomy status: Secondary | ICD-10-CM | POA: Diagnosis not present

## 2022-08-02 DIAGNOSIS — N401 Enlarged prostate with lower urinary tract symptoms: Secondary | ICD-10-CM | POA: Diagnosis not present

## 2022-08-02 HISTORY — DX: Unspecified osteoarthritis, unspecified site: M19.90

## 2022-08-02 HISTORY — PX: TRANSURETHRAL RESECTION OF PROSTATE: SHX73

## 2022-08-02 HISTORY — DX: Male erectile dysfunction, unspecified: N52.9

## 2022-08-02 HISTORY — DX: Benign prostatic hyperplasia with lower urinary tract symptoms: N40.1

## 2022-08-02 HISTORY — DX: Other cervical disc degeneration, unspecified cervical region: M50.30

## 2022-08-02 HISTORY — DX: Prediabetes: R73.03

## 2022-08-02 HISTORY — DX: Obstructive sleep apnea (adult) (pediatric): G47.33

## 2022-08-02 HISTORY — DX: Poor urinary stream: R39.12

## 2022-08-02 HISTORY — DX: Ulcerative colitis, unspecified, without complications: K51.90

## 2022-08-02 LAB — CBC
HCT: 45.8 % (ref 39.0–52.0)
Hemoglobin: 15.1 g/dL (ref 13.0–17.0)
MCH: 30.6 pg (ref 26.0–34.0)
MCHC: 33 g/dL (ref 30.0–36.0)
MCV: 92.9 fL (ref 80.0–100.0)
Platelets: 190 10*3/uL (ref 150–400)
RBC: 4.93 MIL/uL (ref 4.22–5.81)
RDW: 13.8 % (ref 11.5–15.5)
WBC: 8.5 10*3/uL (ref 4.0–10.5)
nRBC: 0 % (ref 0.0–0.2)

## 2022-08-02 LAB — BASIC METABOLIC PANEL
Anion gap: 8 (ref 5–15)
BUN: 18 mg/dL (ref 8–23)
CO2: 25 mmol/L (ref 22–32)
Calcium: 8.9 mg/dL (ref 8.9–10.3)
Chloride: 106 mmol/L (ref 98–111)
Creatinine, Ser: 0.98 mg/dL (ref 0.61–1.24)
GFR, Estimated: >60 mL/min (ref >60)
Glucose, Bld: 105 mg/dL — ABNORMAL HIGH (ref 70–99)
Potassium: 5 mmol/L (ref 3.5–5.1)
Sodium: 139 mmol/L (ref 135–145)

## 2022-08-02 LAB — GLUCOSE, CAPILLARY
Glucose-Capillary: 106 mg/dL — ABNORMAL HIGH (ref 70–99)
Glucose-Capillary: 273 mg/dL — ABNORMAL HIGH (ref 70–99)
Glucose-Capillary: 201 mg/dL — ABNORMAL HIGH (ref 70–99)

## 2022-08-02 SURGERY — TURP (TRANSURETHRAL RESECTION OF PROSTATE)
Anesthesia: General | Site: Bladder

## 2022-08-02 MED ORDER — LIDOCAINE 2% (20 MG/ML) 5 ML SYRINGE
INTRAMUSCULAR | Status: DC | PRN
  Administered 2022-08-02: 100 mg via INTRAVENOUS

## 2022-08-02 MED ORDER — MIDAZOLAM HCL 2 MG/2ML IJ SOLN
INTRAMUSCULAR | Status: AC
  Filled 2022-08-02: qty 2

## 2022-08-02 MED ORDER — ZOLPIDEM TARTRATE 5 MG PO TABS: 5.0000 mg | ORAL_TABLET | Freq: Every evening | ORAL | Status: DC | PRN

## 2022-08-02 MED ORDER — SODIUM CHLORIDE 0.9 % IV SOLN: 250.0000 mL | INTRAVENOUS | Status: DC | PRN

## 2022-08-02 MED ORDER — POLYETHYLENE GLYCOL 3350 17 G PO PACK: 17.0000 g | PACK | Freq: Every day | ORAL | Status: DC | PRN

## 2022-08-02 MED ORDER — OXYBUTYNIN CHLORIDE ER 10 MG PO TB24
ORAL_TABLET | ORAL | Status: AC
  Filled 2022-08-02: qty 1

## 2022-08-02 MED ORDER — SODIUM CHLORIDE 0.9% FLUSH: 3.0000 mL | INTRAVENOUS | Status: DC | PRN

## 2022-08-02 MED ORDER — INSULIN ASPART 100 UNIT/ML IJ SOLN: 0.0000 [IU] | INTRAMUSCULAR | Status: DC

## 2022-08-02 MED ORDER — ONDANSETRON HCL 4 MG/2ML IJ SOLN: 4.0000 mg | INTRAMUSCULAR | Status: DC | PRN

## 2022-08-02 MED ORDER — PROMETHAZINE HCL 25 MG/ML IJ SOLN: 6.2500 mg | INTRAMUSCULAR | Status: DC | PRN

## 2022-08-02 MED ORDER — OXYBUTYNIN CHLORIDE ER 10 MG PO TB24
10.0000 mg | ORAL_TABLET | Freq: Every day | ORAL | Status: DC
  Administered 2022-08-02: 10 mg via ORAL

## 2022-08-02 MED ORDER — CELECOXIB 200 MG PO CAPS
ORAL_CAPSULE | ORAL | Status: AC
  Filled 2022-08-02: qty 1

## 2022-08-02 MED ORDER — BISACODYL 10 MG RE SUPP: 10.0000 mg | Freq: Every day | RECTAL | Status: DC | PRN

## 2022-08-02 MED ORDER — OXYCODONE-ACETAMINOPHEN 5-325 MG PO TABS: 1.0000 | ORAL_TABLET | ORAL | Status: DC | PRN

## 2022-08-02 MED ORDER — LACTATED RINGERS IV SOLN: INTRAVENOUS | Status: DC

## 2022-08-02 MED ORDER — EPHEDRINE SULFATE-NACL 50-0.9 MG/10ML-% IV SOSY
PREFILLED_SYRINGE | INTRAVENOUS | Status: DC | PRN
  Administered 2022-08-02: 5 mg via INTRAVENOUS

## 2022-08-02 MED ORDER — HEPARIN SODIUM (PORCINE) 5000 UNIT/ML IJ SOLN
5000.0000 [IU] | Freq: Three times a day (TID) | INTRAMUSCULAR | Status: DC
  Administered 2022-08-02 – 2022-08-03 (×2): 5000 [IU] via SUBCUTANEOUS

## 2022-08-02 MED ORDER — CEFAZOLIN SODIUM-DEXTROSE 2-4 GM/100ML-% IV SOLN
2.0000 g | Freq: Once | INTRAVENOUS | Status: AC
  Administered 2022-08-02: 2 g via INTRAVENOUS

## 2022-08-02 MED ORDER — PROPOFOL 10 MG/ML IV BOLUS
INTRAVENOUS | Status: DC | PRN
  Administered 2022-08-02: 180 mg via INTRAVENOUS

## 2022-08-02 MED ORDER — FINASTERIDE 5 MG PO TABS
5.0000 mg | ORAL_TABLET | Freq: Every day | ORAL | Status: DC
  Administered 2022-08-02: 5 mg via ORAL
  Filled 2022-08-02: qty 1

## 2022-08-02 MED ORDER — SODIUM CHLORIDE 0.9 % IR SOLN
3000.0000 mL | Status: DC
  Administered 2022-08-02 (×3): 3000 mL

## 2022-08-02 MED ORDER — HYDROMORPHONE HCL 1 MG/ML IJ SOLN: 0.5000 mg | INTRAMUSCULAR | Status: DC | PRN

## 2022-08-02 MED ORDER — CEFAZOLIN SODIUM-DEXTROSE 2-4 GM/100ML-% IV SOLN
INTRAVENOUS | Status: AC
  Filled 2022-08-02: qty 100

## 2022-08-02 MED ORDER — DEXAMETHASONE SODIUM PHOSPHATE 10 MG/ML IJ SOLN
INTRAMUSCULAR | Status: DC | PRN
  Administered 2022-08-02: 5 mg via INTRAVENOUS

## 2022-08-02 MED ORDER — FENTANYL CITRATE (PF) 100 MCG/2ML IJ SOLN
INTRAMUSCULAR | Status: AC
  Filled 2022-08-02: qty 2

## 2022-08-02 MED ORDER — MIDAZOLAM HCL 2 MG/2ML IJ SOLN
INTRAMUSCULAR | Status: DC | PRN
  Administered 2022-08-02: 1 mg via INTRAVENOUS

## 2022-08-02 MED ORDER — ACETAMINOPHEN 325 MG PO TABS
650.0000 mg | ORAL_TABLET | ORAL | Status: DC | PRN
  Administered 2022-08-03: 650 mg via ORAL

## 2022-08-02 MED ORDER — OXYCODONE-ACETAMINOPHEN 5-325 MG PO TABS: 1.0000 | ORAL_TABLET | ORAL | 0 refills | Status: DC | PRN

## 2022-08-02 MED ORDER — DIPHENHYDRAMINE HCL 50 MG/ML IJ SOLN: 12.5000 mg | Freq: Four times a day (QID) | INTRAMUSCULAR | Status: DC | PRN

## 2022-08-02 MED ORDER — ACETAMINOPHEN 500 MG PO TABS
1000.0000 mg | ORAL_TABLET | Freq: Once | ORAL | Status: AC
  Administered 2022-08-02: 1000 mg via ORAL

## 2022-08-02 MED ORDER — ROSUVASTATIN CALCIUM 5 MG PO TABS
5.0000 mg | ORAL_TABLET | Freq: Every day | ORAL | Status: DC
  Administered 2022-08-02: 5 mg via ORAL
  Filled 2022-08-02: qty 1

## 2022-08-02 MED ORDER — CELECOXIB 200 MG PO CAPS
200.0000 mg | ORAL_CAPSULE | Freq: Once | ORAL | Status: AC
  Administered 2022-08-02: 200 mg via ORAL

## 2022-08-02 MED ORDER — SODIUM CHLORIDE 0.9% FLUSH: 3.0000 mL | Freq: Two times a day (BID) | INTRAVENOUS | Status: DC

## 2022-08-02 MED ORDER — TRIPLE ANTIBIOTIC 3.5-400-5000 EX OINT: 1.0000 | TOPICAL_OINTMENT | Freq: Three times a day (TID) | CUTANEOUS | Status: DC | PRN

## 2022-08-02 MED ORDER — FENTANYL CITRATE (PF) 100 MCG/2ML IJ SOLN: 25.0000 ug | INTRAMUSCULAR | Status: DC | PRN

## 2022-08-02 MED ORDER — VITAMIN D3 25 MCG (1000 UNIT) PO TABS
50.0000 ug | ORAL_TABLET | Freq: Every day | ORAL | Status: DC
  Administered 2022-08-02: 50 ug via ORAL
  Filled 2022-08-02: qty 2

## 2022-08-02 MED ORDER — ADULT MULTIVITAMIN W/MINERALS CH
1.0000 | ORAL_TABLET | Freq: Every day | ORAL | Status: DC
  Administered 2022-08-02: 1 via ORAL
  Filled 2022-08-02: qty 1

## 2022-08-02 MED ORDER — DIPHENHYDRAMINE HCL 12.5 MG/5ML PO ELIX: 12.5000 mg | ORAL_SOLUTION | Freq: Four times a day (QID) | ORAL | Status: DC | PRN

## 2022-08-02 MED ORDER — AMISULPRIDE (ANTIEMETIC) 5 MG/2ML IV SOLN: 10.0000 mg | Freq: Once | INTRAVENOUS | Status: DC | PRN

## 2022-08-02 MED ORDER — PHENYLEPHRINE 80 MCG/ML (10ML) SYRINGE FOR IV PUSH (FOR BLOOD PRESSURE SUPPORT)
PREFILLED_SYRINGE | INTRAVENOUS | Status: DC | PRN
  Administered 2022-08-02 (×4): 80 ug via INTRAVENOUS

## 2022-08-02 MED ORDER — HEPARIN SODIUM (PORCINE) 5000 UNIT/ML IJ SOLN
INTRAMUSCULAR | Status: AC
  Filled 2022-08-02: qty 1

## 2022-08-02 MED ORDER — FENTANYL CITRATE (PF) 250 MCG/5ML IJ SOLN
INTRAMUSCULAR | Status: DC | PRN
  Administered 2022-08-02: 50 ug via INTRAVENOUS
  Administered 2022-08-02: 25 ug via INTRAVENOUS

## 2022-08-02 MED ORDER — PROPOFOL 10 MG/ML IV BOLUS
INTRAVENOUS | Status: AC
  Filled 2022-08-02: qty 20

## 2022-08-02 MED ORDER — ONDANSETRON HCL 4 MG/2ML IJ SOLN
INTRAMUSCULAR | Status: DC | PRN
  Administered 2022-08-02: 4 mg via INTRAVENOUS

## 2022-08-02 MED ORDER — ACETAMINOPHEN 500 MG PO TABS
ORAL_TABLET | ORAL | Status: AC
  Filled 2022-08-02: qty 2

## 2022-08-02 MED ORDER — DOCUSATE SODIUM 100 MG PO CAPS: 100.0000 mg | ORAL_CAPSULE | Freq: Every day | ORAL | 0 refills | Status: AC | PRN

## 2022-08-02 MED ORDER — SODIUM CHLORIDE 0.45 % IV SOLN
INTRAVENOUS | Status: DC
  Administered 2022-08-02: 1 mL via INTRAVENOUS

## 2022-08-02 MED ORDER — SODIUM CHLORIDE 0.9 % IR SOLN
Status: DC | PRN
  Administered 2022-08-02 (×5): 6000 mL via INTRAVESICAL

## 2022-08-02 SURGICAL SUPPLY — 25 items
BAG DRAIN URO-CYSTO SKYTR STRL (DRAIN) ×2 IMPLANT
BAG DRN RND TRDRP ANRFLXCHMBR (UROLOGICAL SUPPLIES) ×1
BAG DRN UROCATH (DRAIN) ×1
BAG URINE DRAIN 2000ML AR STRL (UROLOGICAL SUPPLIES) ×2 IMPLANT
BAG URINE LEG 500ML (DRAIN) IMPLANT
CATH FOLEY 2WAY SLVR 30CC 20FR (CATHETERS) IMPLANT
CATH FOLEY 3WAY 30CC 22FR (CATHETERS) ×2 IMPLANT
CLOTH BEACON ORANGE TIMEOUT ST (SAFETY) ×2 IMPLANT
ELECT BIVAP BIPO 22/24 DONUT (ELECTROSURGICAL)
ELECT REM PT RETURN 9FT ADLT (ELECTROSURGICAL)
ELECTRD BIVAP BIPO 22/24 DONUT (ELECTROSURGICAL) IMPLANT
ELECTRODE REM PT RTRN 9FT ADLT (ELECTROSURGICAL) IMPLANT
GLOVE BIO SURGEON STRL SZ7 (GLOVE) ×2 IMPLANT
GOWN STRL REUS W/TWL LRG LVL3 (GOWN DISPOSABLE) ×2 IMPLANT
HOLDER FOLEY CATH W/STRAP (MISCELLANEOUS) ×2 IMPLANT
IV NS IRRIG 3000ML ARTHROMATIC (IV SOLUTION) ×4 IMPLANT
KIT TURNOVER CYSTO (KITS) ×2 IMPLANT
LOOP CUT BIPOLAR 24F LRG (ELECTROSURGICAL) ×2 IMPLANT
MANIFOLD NEPTUNE II (INSTRUMENTS) ×2 IMPLANT
PACK CYSTO (CUSTOM PROCEDURE TRAY) ×2 IMPLANT
SYR 30ML LL (SYRINGE) ×2 IMPLANT
SYR TOOMEY IRRIG 70ML (MISCELLANEOUS) ×1
SYRINGE TOOMEY IRRIG 70ML (MISCELLANEOUS) ×2 IMPLANT
TUBE CONNECTING 12X1/4 (SUCTIONS) ×2 IMPLANT
TUBING UROLOGY SET (TUBING) ×2 IMPLANT

## 2022-08-02 NOTE — H&P (Signed)
Office Visit Report     05/11/2022   --------------------------------------------------------------------------------   CC/HPI: Rolly SalterWilliam Varricchio is a 71 year old male seen in follow-up with a history of Peyronie's disease, prostate cancer screening and BPH with LUTS.   1. Gross hematuria:  -Negative evaluation in 11/2021 other than prostatomegaly, likely source of his bleeding.  -CT hematuria protocol 11/2021 with no evidence of significant GU pathology.  -Cystoscopy 11/05/2021 with obstructing prostate with 2 cm protrusion into the bladder. No suspicious bladder lesion.  He denies a smoking history. He denies a family history of bladder cancer or kidney history. His father did have prostate cancer.  Urine cytology 10/05/2021: Negative for high-grade urothelial carcinoma.   #2. Peyronie's disease:  He was diagnosed with Peyronie's disease in 2019 and underwent 4 cycles of Xiaflex which completed in 11/2019 by Dr. Vernie Ammonsttelin. Patient reports that he is functionally straight and has no significant residual curvature. He denies difficulty with intercourse. He denies problems with erections.   3. Prostate cancer screening: His last PSA was 1.0 in 06/2021 and when corrected for the use of finasteride is 2.0. His father has a history of prostate cancer and underwent a prostatectomy at age 71. Patient denies bone pain or expected weight loss. Stable appetite.   4. BPH/LUTS:  -He has had worsening urinary tract symptoms over the past year.  -His IPSS score today is 11, quite like 2.  -Uroflow with average flow rate 1.7 cc/second and peak flow rate 4.7 cc/second with PVR 36 and mL.  -He takes finasteride with no benefit. He did not tolerate tamsulosin.  -CT A/P 11/2021 with approximately 60 g prostate with additional 2 cm protrusion into the bladder.  -He would like to schedule TURP.   He does have a history of an ileostomy for ulcerative colitis and we are unable to perform a DRE.   Patient currently denies  fever, chills, sweats, nausea, vomiting, abdominal or flank pain, gross hematuria or dysuria.     ALLERGIES: No Allergies    MEDICATIONS: Metformin Hcl 500 mg tablet 1 tablet PO BID  Sildenafil Citrate 20 mg tablet 1-5 tablet PO PRN  Tamsulosin Hcl 0.4 mg capsule 1 capsule PO Q HS  Adult Aspirin Low Strength 81 MG TBDP Oral  Co Q-10 10 mg capsule Oral  Crestor 5 mg tablet Oral  Prostaglandin E1 100 % powder 1 syringe Intracavernosal Q6WK Prostaglandin E1 20ug/ml prefilled syringe. Patient to bring to next appt. Thank you.     GU PSH: Cystoscopy - 11/05/2021 Injection for Peyronies - 2021, 2021, 2020, 2020, 2020, 2020, 2020, 2020 Locm 300-399Mg /Ml Iodine,1Ml - 10/26/2021 Penile Injection - 2021, 2020, 2020, 2020       PSH Notes: Total Abdominal Colectomy With Ileostomy   NON-GU PSH: Total colectomy - 2014     GU PMH: BPH w/LUTS - 11/05/2021, - 10/05/2021, - 03/12/2021, Benign prostatic hyperplasia with urinary obstruction, - 2016 Gross hematuria - 11/05/2021, - 10/26/2021, - 10/05/2021 Urinary Frequency - 11/05/2021, (Stable), - 10/05/2021, - 03/12/2021 Encounter for Prostate Cancer screening - 10/05/2021, - 03/12/2021 Peyronies Disease - 03/12/2021, - 2021, - 2021 (Improving), This was his 3rd cycle. I told him that he has had fairly significant improvement and so he would need to determine if he wanted to proceed with his for cycle especially if he saw as much improvement again., - 2020 (Improving), Injection was well tolerated today. He will return in 48 hours his 2nd injection., - 2020 (Stable), He will return in 6 weeks for his 3rd course  treatments., - 2020 (Improving), There has been improvement. We discussed the fact that he will let me know when he is satisfied with the amount of improvement that he has seen despite the actual measured degrees of improvement ., - 2020, - 2020 (Stable), He is interested in proceeding with Xiaflex. Once this has been cleared with his insurance we will begin  treatment., - 2020, Left, He has developed penile curvature secondary to Peyronie's disease. Since there has been some slight improvement were going to hold off on any form of treatment until he returns in 6 months to determine if that is necessary., - 2019 Elevated PSA (Stable), A PSA will be obtained today. I will continue to monitor this every 6 months. - 2020, (Stable), His PSA when adjusted for the effects of the finasteride that he is taking is currently 2.76 which remains stable. I will continue to monitor this on a yearly basis., - 2019 (Stable), His PSA when corrected for the effects of finasteride is 2.34. , - 2018 (Stable), His PSA continues to remain stable when corrected for the effects of finasteride at 2.02. I will continue to monitor this on a yearly basis., - 2017, Elevated prostate specific antigen (PSA), - 2016 ED due to arterial insufficiency, His erectile dysfunction has very mild but he is interested in using sildenafil to augment his erections. - 2018 Weak Urinary Stream (Stable), He was having a slow stream especially in the morning upon awakening before being treated for his outlet obstruction. - 2017 Spermatocele (single), Spermatocele of epididymis, single - 2016 Retrograde ejaculation, Retrograde ejaculation - 2015 Other microscopic hematuria, Microscopic hematuria - 2014      PMH Notes: Elevated PSA: His PSA had risen in the past to as high as 4.08 and when treated with antibiotics fell back down again on more than one occasion. He's had a prior APR and therefore has no rectum.    LUTS: He had noted slowing of his urinary stream and hesitancy more pronounced when his bladder was very full such as in the morning. I prescribed Rapaflo with good results. He then had some progression of symptoms and I added finasteride. He stopped taking Rapaflo in 2016.   Current therapy: finasteride added 10/15.    Left spermatocele: He had noted a left scrotal lesion in 11/15. By  examination he had a left lower pole epididymal cyst/spermatocele and was reassured at that time.     NON-GU PMH: Encounter for general adult medical examination without abnormal findings, Encounter for preventive health examination - 2016 Personal history of other diseases of the digestive system, History of ulcerative colitis - 2014 Personal history of other diseases of the nervous system and sense organs, History of sleep apnea - 2014    FAMILY HISTORY: leukemia - Father Prostate Cancer - Father   SOCIAL HISTORY: Marital Status: Married Preferred Language: English; Ethnicity: Not Hispanic Or Latino; Race: White Current Smoking Status: Patient has never smoked.  Does drink.  Drinks 4+ caffeinated drinks per day.     Notes: Never A Smoker, Marital History - Currently Married, Alcohol Use, Caffeine Use, Death In The Family Father   REVIEW OF SYSTEMS:    GU Review Male:   Patient denies frequent urination, hard to postpone urination, burning/ pain with urination, get up at night to urinate, leakage of urine, stream starts and stops, trouble starting your stream, have to strain to urinate , erection problems, and penile pain.  Gastrointestinal (Upper):   Patient denies indigestion/ heartburn, vomiting, and  nausea.  Gastrointestinal (Lower):   Patient denies diarrhea and constipation.  Constitutional:   Patient denies fever, night sweats, weight loss, and fatigue.  Skin:   Patient denies skin rash/ lesion and itching.  Eyes:   Patient denies blurred vision and double vision.  Ears/ Nose/ Throat:   Patient denies sore throat and sinus problems.  Hematologic/Lymphatic:   Patient denies swollen glands and easy bruising.  Cardiovascular:   Patient denies leg swelling and chest pains.  Respiratory:   Patient denies cough and shortness of breath.  Endocrine:   Patient denies excessive thirst.  Musculoskeletal:   Patient denies back pain and joint pain.  Neurological:   Patient denies headaches  and dizziness.  Psychologic:   Patient denies depression and anxiety.   VITAL SIGNS: None   MULTI-SYSTEM PHYSICAL EXAMINATION:    Constitutional: Well-nourished. No physical deformities. Normally developed. Good grooming.  Respiratory: No labored breathing, no use of accessory muscles.   Cardiovascular: Normal temperature, normal extremity pulses, no swelling, no varicosities.  Gastrointestinal: No mass, no tenderness, no rigidity, non obese abdomen.     Complexity of Data:  Source Of History:  Patient, Medical Record Summary  Lab Test Review:   PSA  Records Review:   AUA Symptom Score, Previous Doctor Records  Urine Test Review:   Urinalysis  Urodynamics Review:   Review Bladder Scan, Review Flow Rate   03/12/21 06/26/18 06/17/17 06/16/16 06/19/14 06/14/13 06/30/12 05/28/11  PSA  Total PSA 0.90 ng/mL 1.38 ng/mL 1.17 ng/mL 1.01 ng/dl 4.09  8.11  9.14  7.82     PROCEDURES:         Flow Rate - 51741  Average Flow Rate: 1.7 cc/sec  Voided Volume: 109 cc  Peak Flow Rate: 4.7 cc/sec  Total Void Time: 1:11 min:sec  Time of Peak Flow: 0:01 min:sec  Flow Time: 1:03 min:sec           PVR Ultrasound - 95621  Scanned Volume: 36 cc   ASSESSMENT:      ICD-10 Details  1 GU:   BPH w/LUTS - N40.1   2   Encounter for Prostate Cancer screening - Z12.5   3   Gross hematuria - R31.0   4   Peyronies Disease - N48.6   5   Weak Urinary Stream - R39.12    PLAN:           Orders Labs PSA          Document Letter(s):  Created for Patient: Clinical Summary         Notes:   #1. Gross hematuria:  -Negative evaluation in 11/2021 with CT hematuria protocol and cystoscopy.  -We discussed his prostate is likely the source of his bleeding.  -Continue finasteride.   2. Peyronie's disease:  -S/p 4 cycles of Xiaflex in 11/2019  -He is now functionally straight   #3. Prostate cancer screening:  -PSA was 1.0 in 06/2021 and 2.0 when corrected for use of finasteride. Will obtain PSA today and  notify of result.   4. BPH with LUTS: Worsening symptoms. IPSS 11. PVR 36 and mL. CT A/P 11/2021 with 60 cc prostate. Uroflow with average flow rate 1.7 cc/second. We discussed options and he elects proceed with TURP. Discussed risk and benefits. Surgery letter sent.   Risks and benefits of Transurethral Resection of the Prostate were reviewed in detail including infection, bleeding, blood transfusion, injury to bladder/urethra/surrounding structures, erectile dysfunction, urinary incontinence, bladder neck contracture, persistent obstructive and irritative voiding symptoms,  and global anesthesia risks including but not limited to CVA, MI, DVT, PE, pneumonia, and death. He expressed understanding and desire to proceed.    CC: Blane Ohara, MD    Urology Preoperative H&P   Chief Complaint: BPH  History of Present Illness: DAGMAWI VENABLE is a 71 y.o. male with BPH here for bipolar TURP.    Past Medical History:  Diagnosis Date   Benign localized prostatic hyperplasia with lower urinary tract symptoms (LUTS)    DDD (degenerative disc disease), cervical    ED (erectile dysfunction)    Ileostomy in place [facility]) 1991   s/p coloprototectomy for UC   Mixed hyperlipidemia    OA (osteoarthritis)    OSA on CPAP    uses nightly per pt   (followed by dr c young)   Peyronie's disease 2019   completed treatment w/ injections 03/ 2021 by dr Vernie Ammons   Pre-diabetes    followed by pcp    (07-28-2022  per pt checks blood sugar 3-4 times daily, stated last A1c  5.6,  takes metformin)   UC (ulcerative colitis) (HCC)    s/p  completed colectomy w/ ileostomy 1991   Weak urinary stream     Past Surgical History:  Procedure Laterality Date   CATARACT EXTRACTION W/ INTRAOCULAR LENS IMPLANT Bilateral 2020   COLOPROCTECTOMY W/ ILEO J POUCH  1991   for UC  (ileostomy RLQ)  w/ appendectomy   INGUINAL HERNIA REPAIR  2002   KNEE ARTHROSCOPY Left    x3  last one 2009   REPLACEMENT UNICONDYLAR JOINT KNEE  Left 09/27/2008   @[facility] [place] by dr   REVISION TOTAL KNEE ARTHROPLASTY Left 09/09/2020   @[facility] -- 11/07/2020 [place] by dr shields;   Uni partial TK to TKA   TONSILLECTOMY     age 38    Allergies:  Allergies  Allergen Reactions   Lipitor [Atorvastatin] Other (See Comments)    Muscle cramps    Family History  Problem Relation Age of Onset   Cerebrovascular Accident Mother    Acute myelogenous leukemia Father    Prostate cancer Father    Coronary artery disease Maternal Uncle    Coronary artery disease Maternal Grandmother    Stroke Paternal Grandmother    Stroke Paternal Grandfather     Social History:  reports that he has never smoked. He has never used smokeless tobacco. He reports current alcohol use of about 7.0 standard drinks of alcohol per week. He reports that he does not use drugs.  ROS: A complete review of systems was performed.  All systems are negative except for pertinent findings as noted.  Physical Exam:  Vital signs in last 24 hours:   Constitutional:  Alert and oriented, No acute distress Cardiovascular: Regular rate and rhythm Respiratory: Normal respiratory effort, Lungs clear bilaterally GI: Abdomen is soft, nontender, nondistended, no abdominal masses GU: No CVA tenderness Lymphatic: No lymphadenopathy Neurologic: Grossly intact, no focal deficits Psychiatric: Normal mood and affect  Laboratory Data:  No results for input(s): "WBC", "HGB", "HCT", "PLT" in the last 72 hours.  No results for input(s): "NA", "K", "CL", "GLUCOSE", "BUN", "CALCIUM", "CREATININE" in the last 72 hours.  Invalid input(s): "CO3"   No results found for this or any previous visit (from the past 24 hour(s)). No results found for this or any previous visit (from the past 240 hour(s)).  Renal Function: No results for input(s): "CREATININE" in the last 168 hours. Estimated Creatinine Clearance: 71.7 mL/min (by C-G formula  based on SCr of 1.04 mg/dL).  Radiologic  Imaging: No results found.  I independently reviewed the above imaging studies.  Assessment and Plan ALIAS VILLAGRAN is a 71 y.o. male with BPH here for bipolar TURP.   Matt R. Dorianna Mckiver MD 08/02/2022, 10:04 AM  [facility] Pager: [phone]): [phone]

## 2022-08-02 NOTE — Op Note (Signed)
Operative Note  Preoperative diagnosis:  1.  BPH with bladder outlet obstruction  Postoperative diagnosis: 1.  BPH with bladder outlet obstruction  Procedure(s): 1.  Bipolar transurethral resection of prostate  Surgeon: Jettie Pagan, MD  Assistants:  None  Anesthesia:  General  Complications:  None  EBL:  8ml  Specimens: 1. Prostate chips ID Type Source Tests Collected by Time Destination  1 : prostate chips Tissue PATH GU resection / TURBT / partial nephrectomy SURGICAL PATHOLOGY Jannifer Hick, MD 08/02/2022 1317    Drains/Catheters: 1.  22Fr 3 way catheter with 110ml water into balloon  Intraoperative findings:   Trilobar obstructing prostate with large intravesical portion growing into trigone. Excellent resection with wide open prostatic fossa. No injury to ureteral orifices or sphincter. Excellent hemostasis.  Indication:  Tyler Giles is a 71 y.o. male with BPH with bladder outlet obstruction presenting for transurethral resection of the prostate. He had a TRUS volume of 60. Uroflow demonstrated average flow rate of 1.7cc/second. Preop cystoscopy demonstrated trilobar obstructing prostate. He has failed 5 alpha reductase inhibitors and did not tolerate alpha blockers.  After thorough discussion including all relevant risk benefits and alternatives, he presents today for a bipolar TURP.  Description of procedure: The indication, alternatives, benefits and risks were discussed with the patient and informed consent was obtained.  Patient was brought to the operating room table, positioned supine, secured with a safety strap.  Pneumatic compression devices were placed on the lower extremities.  After the administration of intravenous antibiotics and general anesthesia, the patient was repositioned into the dorsal lithotomy position.  All pressure points were carefully padded.  A rectal examination was performed confirming a smooth symmetric enlarged gland.  The genitalia were  prepped and draped in standard sterile manner.  A timeout was completed, verifying the correct patient, surgical procedure and positioning prior to beginning the procedure.  Isotonic sodium chloride was used for irrigation.  A 26 French continuous-flow resectoscope sheath with the visual obturator and a 30 degree lens was advanced under direct vision into the bladder.  The anterior urethra appeared normal in its entirety.The prostatic urethra was elongated with trilobar hyperplasia.  On cystoscopic evaluation, his bladder capacity appeared normal, the bladder wall was noted to expand symmetrically in all dimensions.  There were no tumors, stones or foreign bodies present. The bladder was trabeculated with normal-appearing mucosa.  Both ureteral orifices were in their normal anatomic positions with clear urinary reflux noted bilaterally.  The obturator was removed and replaced by the working element with a resection loop.  The location of the ureteral orifices and the prostatic configuration were again confirmed.  Starting at the bladder neck and proceeding distally to the verumontanum a transurethral section of the prostate was performed using bipolar using energy of 4 and 5 for cutting and coagulation, respectively.  The procedure began at the bladder neck at the 5 o'clock and 7 o'clock positions and carefully carried distally to the verumontanum, resecting the intervening prostatic adenoma. There was a large intravesical portion extending into the trigone and this was resected carefully ensuring no injury to the ureteral orifices. Next the left lateral lobe was resected to the level of the transverse capsular fibers.  The identical procedure was performed on the right lobe.  Attention was then directed anteriorly and the resection was completed from the 10 o'clock to 2 o'clock positions.  All bleeding vessels were fulgurated achieving meticulous hemostasis.  The bladder was irrigated with a Toomey syringe,  ensuring  removal of all prostate chips which were sent to pathology for evaluation.  Having completed the resection and the chips removed, we again confirmed hemostasis with the loop with coagulating current.  Upon completion of the entire procedure, the bladder and posterior urethra were reexamined, confirming open prostatic urethra and bladder neck without evidence of bleeding or perforation.  Both ureteral orifices and the external sphincter were noted to be intact.  The resectoscope was withdrawn under direct vision and a 22 French three-way Foley catheter with a 30 cc balloon was inserted into the bladder.  The balloon was inflated with 30 cc of sterile water.  After multiple manual irrigations ensuring light pink return of the irrigant, the procedure was terminated.  The catheter was attached to a drainage bag and continuous bladder irrigation was started with normal saline.  The patient was positioned supine.  At the end of the procedure, all counts were correct.  Patient tolerated the procedure well and was taken to the recovery room satisfactory condition.  Plan: Continuous bladder irrigation overnight.  Plan to discharge home tomorrow with Foley catheter in place and void trial in the office in 3 days.  Matt R. Murlene Revell MD [facility]  Pager: [phone]

## 2022-08-02 NOTE — Discharge Instructions (Signed)

## 2022-08-02 NOTE — Progress Notes (Signed)
Irregular heart rate via pulse oximetry and digital palpation at wrist. Counted for one full minute. Patient is asymptomatic and states that he has not had an EKG in at least 10 years. No report of dysthymias from OR or PACU. [facility] called. Spoke with receptionist at call center and left detailed message for MD, Dr. Delanna Ahmadi regarding  patient status.  CBG 273 but patient elects not to take insulin at this time but would rather wait until next CBG. States his Blood sugar has never been this high. Explained in detail the possible causes including meds received pre, intra, and post operative. Ate a very large meal earlier for dinner with large amount of carbohydrates. He has not taken his Metformin as he takes at home.

## 2022-08-02 NOTE — Transfer of Care (Signed)
Immediate Anesthesia Transfer of Care Note  Patient: Tyler Giles  Procedure(s) Performed: TRANSURETHRAL RESECTION OF THE PROSTATE (TURP), BIPOLAR (Bladder)  Patient Location: PACU  Anesthesia Type:General  Level of Consciousness: awake, alert , and oriented  Airway & Oxygen Therapy: Patient Spontanous Breathing and Patient connected to nasal cannula oxygen  Post-op Assessment: Report given to RN and Post -op Vital signs reviewed and stable  Post vital signs: Reviewed and stable  Last Vitals:  Vitals Value Taken Time  BP    Temp    Pulse    Resp    SpO2      Last Pain:  Vitals:   08/02/22 1014  TempSrc: Oral  PainSc: 0-No pain      Patients Stated Pain Goal: 6 (08/02/22 1014)  Complications: No notable events documented.

## 2022-08-02 NOTE — Anesthesia Preprocedure Evaluation (Addendum)
Anesthesia Evaluation  Patient identified by MRN, date of birth, ID band Patient awake    Reviewed: Allergy & Precautions, NPO status , Patient's Chart, lab work & pertinent test results  History of Anesthesia Complications Negative for: history of anesthetic complications  Airway Mallampati: II  TM Distance: >3 FB Neck ROM: Full    Dental no notable dental hx. (+) Dental Advisory Given   Pulmonary sleep apnea and Continuous Positive Airway Pressure Ventilation    Pulmonary exam normal        Cardiovascular negative cardio ROS Normal cardiovascular exam     Neuro/Psych negative neurological ROS     GI/Hepatic negative GI ROS, Neg liver ROS,,,UC, S/P colectomy 1991   Endo/Other  negative endocrine ROS    Renal/GU negative Renal ROS     Musculoskeletal  (+) Arthritis , Osteoarthritis,    Abdominal   Peds  Hematology negative hematology ROS (+)   Anesthesia Other Findings   Reproductive/Obstetrics                             Anesthesia Physical Anesthesia Plan  ASA: 2  Anesthesia Plan: General   Post-op Pain Management: Tylenol PO (pre-op)* and Celebrex PO (pre-op)*   Induction: Intravenous  PONV Risk Score and Plan: 4 or greater and Ondansetron, Dexamethasone, Diphenhydramine and Treatment may vary due to age or medical condition  Airway Management Planned: LMA  Additional Equipment:   Intra-op Plan:   Post-operative Plan: Extubation in OR  Informed Consent: I have reviewed the patients History and Physical, chart, labs and discussed the procedure including the risks, benefits and alternatives for the proposed anesthesia with the patient or authorized representative who has indicated his/her understanding and acceptance.     Dental advisory given  Plan Discussed with: Anesthesiologist and CRNA  Anesthesia Plan Comments:        Anesthesia Quick Evaluation

## 2022-08-02 NOTE — Anesthesia Procedure Notes (Signed)
Procedure Name: LMA Insertion Date/Time: 08/02/2022 12:13 PM  Performed by: Dairl Ponder, CRNAPre-anesthesia Checklist: Patient identified, Emergency Drugs available, Suction available and Patient being monitored Patient Re-evaluated:Patient Re-evaluated prior to induction Oxygen Delivery Method: Circle System Utilized Preoxygenation: Pre-oxygenation with 100% oxygen Induction Type: IV induction Ventilation: Mask ventilation without difficulty LMA: LMA inserted LMA Size: 4.0 Number of attempts: 1 Airway Equipment and Method: Bite block Placement Confirmation: positive ETCO2 Tube secured with: Tape Dental Injury: Teeth and Oropharynx as per pre-operative assessment

## 2022-08-03 ENCOUNTER — Encounter (HOSPITAL_BASED_OUTPATIENT_CLINIC_OR_DEPARTMENT_OTHER): Payer: Self-pay | Admitting: Urology

## 2022-08-03 DIAGNOSIS — N401 Enlarged prostate with lower urinary tract symptoms: Secondary | ICD-10-CM | POA: Diagnosis not present

## 2022-08-03 LAB — CBC
HCT: 43.2 % (ref 39.0–52.0)
Hemoglobin: 14.2 g/dL (ref 13.0–17.0)
MCH: 30.8 pg (ref 26.0–34.0)
MCHC: 32.9 g/dL (ref 30.0–36.0)
MCV: 93.7 fL (ref 80.0–100.0)
Platelets: 199 10*3/uL (ref 150–400)
RBC: 4.61 MIL/uL (ref 4.22–5.81)
RDW: 14 % (ref 11.5–15.5)
WBC: 11.5 10*3/uL — ABNORMAL HIGH (ref 4.0–10.5)
nRBC: 0 % (ref 0.0–0.2)

## 2022-08-03 LAB — BASIC METABOLIC PANEL
Anion gap: 5 (ref 5–15)
BUN: 15 mg/dL (ref 8–23)
CO2: 26 mmol/L (ref 22–32)
Calcium: 9 mg/dL (ref 8.9–10.3)
Chloride: 107 mmol/L (ref 98–111)
Creatinine, Ser: 0.87 mg/dL (ref 0.61–1.24)
GFR, Estimated: >60 mL/min (ref >60)
Glucose, Bld: 120 mg/dL — ABNORMAL HIGH (ref 70–99)
Potassium: 4.6 mmol/L (ref 3.5–5.1)
Sodium: 138 mmol/L (ref 135–145)

## 2022-08-03 LAB — SURGICAL PATHOLOGY

## 2022-08-03 LAB — GLUCOSE, CAPILLARY: Glucose-Capillary: 117 mg/dL — ABNORMAL HIGH (ref 70–99)

## 2022-08-03 MED ORDER — ACETAMINOPHEN 325 MG PO TABS
ORAL_TABLET | ORAL | Status: AC
  Filled 2022-08-03: qty 2

## 2022-08-03 MED ORDER — HEPARIN SODIUM (PORCINE) 5000 UNIT/ML IJ SOLN
INTRAMUSCULAR | Status: AC
  Filled 2022-08-03: qty 1

## 2022-08-03 NOTE — Discharge Summary (Signed)
Date of admission: 08/02/2022  Date of discharge: 08/03/2022  Admission diagnosis: BPH  Discharge diagnosis: BPH  Secondary diagnoses: none  History and Physical: For full details, please see admission history and physical. Briefly, Tyler Giles is a 71 y.o. year old patient with BPH who underwent TURP.   Hospital Course: The patient recovered in the usual expected fashion.  He had his diet advanced slowly.  Initially managed with IV pain control, then transitioned to PO meds when he was tolerating oral intake.  His labs were stable throughout the hospital course.  He was discharged to home on POD#1.  At the time of discharge the patient was tolerating a regular diet, passing flatus, ambulating, had adequate pain control and was agreeable to discharge.  Follow up as scheduled.  He will also followup with his cardiologist given asymptomatic arrhythmia that has resolved.  Laboratory values:  Recent Labs    08/02/22 1435 08/03/22 0634  HGB 15.1 14.2  HCT 45.8 43.2   Recent Labs    08/02/22 1435 08/03/22 0634  CREATININE 0.98 0.87    Disposition: Home  Discharge instruction: The patient was instructed to be ambulatory but told to refrain from heavy lifting, strenuous activity, or driving.   Discharge medications:  Allergies as of 08/03/2022       Reactions   Lipitor [atorvastatin] Other (See Comments)   Muscle cramps        Medication List     TAKE these medications    aspirin EC 81 MG tablet Take 81 mg by mouth at bedtime.   celecoxib 200 MG capsule Commonly known as: CELEBREX Take 1 capsule (200 mg total) by mouth daily. What changed:  when to take this reasons to take this   Cholecalciferol 100 MCG (4000 UT) Caps Take by mouth.   Vitamin D3 50 MCG (2000 UT) Tabs Take 2 tablets by mouth daily at 12 noon.   COQ10 150 MG Caps Take 1 capsule by mouth daily at 12 noon.   docusate sodium 100 MG capsule Commonly known as: Colace Take 1 capsule (100 mg  total) by mouth daily as needed.   finasteride 5 MG tablet Commonly known as: PROSCAR TAKE 1 TABLET BY MOUTH ONCE DAILY What changed: when to take this   Fish Oil 1000 MG Caps Take 1 capsule by mouth daily.   metFORMIN 500 MG 24 hr tablet Commonly known as: GLUCOPHAGE-XR TAKE 1 TABLET BY MOUTH TWICE A DAY What changed: when to take this   multivitamin capsule Take 1 capsule by mouth daily.   oxyCODONE-acetaminophen 5-325 MG tablet Commonly known as: Percocet Take 1 tablet by mouth every 4 (four) hours as needed for up to 18 doses for severe pain.   rosuvastatin 10 MG tablet Commonly known as: Crestor Take 1 tablet (10 mg total) by mouth daily. What changed:  how much to take when to take this   zinc gluconate 50 MG tablet Take 50 mg by mouth daily as needed.        Followup:   Follow-up Information     [facility] Follow up on 08/05/2022.   Why: 8AM Contact information: [address] [place] [address] [phone]                Matt R. Elliotte Marsalis MD [facility]  Pager: [phone]

## 2022-08-03 NOTE — Anesthesia Postprocedure Evaluation (Signed)
Anesthesia Post Note  Patient: Tyler Giles  Procedure(s) Performed: TRANSURETHRAL RESECTION OF THE PROSTATE (TURP), BIPOLAR (Bladder)     Anesthesia Type: General Anesthetic complications: no   No notable events documented.  Last Vitals:  Vitals:   08/03/22 0218 08/03/22 0535  BP: 121/80 123/81  Pulse: 67 70  Resp: 14 18  Temp: 36.6 C 36.7 C  SpO2: 96% 94%    Last Pain:  Vitals:   08/03/22 0535  TempSrc:   PainSc: 3                  [name] DANIEL

## 2022-08-11 ENCOUNTER — Ambulatory Visit: Payer: Medicare Other

## 2022-08-17 ENCOUNTER — Ambulatory Visit: Payer: Medicare Other

## 2022-08-17 NOTE — Progress Notes (Signed)
Nurse Visit Patient came in for both ears irrigation Left with both ears clean

## 2022-10-12 ENCOUNTER — Other Ambulatory Visit: Payer: Self-pay | Admitting: Family Medicine

## 2023-01-08 NOTE — Progress Notes (Signed)
11/20/20- 69 yoM never smoker for sleep evaluation courtesy of Mickey Farber, MD with OSA needing CPAP supplies. Medical problem list includes Ulcerative Colitis/ Ileostomy, Osteoarthritis, Hyperlipidemia,  NPSG 12/30/93- AHI 36/ hr, desaturation to 85%, body weight  LOV with me 2011- for OSA and Insomnia, on autopap.4-20 Epworth score-6 Body weight today- 195 lbs Download- Covid vax-3 Moderna Flu vax-flu He has 2 S9 Resmed machines and a travel machine. Getting old but still working. Now actively needs replacement mask, hoses and supplies. Understands [employer] might require updated study, but he reports excellent compliance and control.  Sleeping well, but ileostomy gets him up a couple of times/ night.  Due to UC he is careful to stay fully vaccinated and he arranged to get BCG after hearing it might help immune system with Covid.  01/11/23- 71 yoM never smoker followed for OSA, complicated by Ulcerative Colitis/ Ileostomy, Osteoarthritis, Hyperlipidemia, hx BCG vax, Palpitation,  CPAP auto 5-20/ [employer]     replaced 11/20/20 Download compliance- 100%, AHI 2.4/ hr Body weight today-198 lbs ------Pt is doing well. No issues with cpap machine  Download reviewed.  He says he is sleeping okay.  Hx of insomnia in the past but not using sleep medications any longer. Medical management recently included a TURP then cardiac workup for PVCs. He travel to the [employer] in [place] and did well on that trip without sleep disturbance.  ROS-see HPI  + = positive Constitutional:    weight loss, night sweats, fevers, chills, fatigue, lassitude. HEENT:    headaches, difficulty swallowing, tooth/dental problems, sore throat,       sneezing, itching, ear ache, nasal congestion, post nasal drip, snoring CV:    chest pain, orthopnea, PND, swelling in lower extremities, anasarca,                                   dizziness, palpitations Resp:   shortness of breath with exertion or at rest.                 productive cough,   non-productive cough, coughing up of blood.              change in color of mucus.  wheezing.   Skin:    rash or lesions. GI:  No-   heartburn, indigestion, abdominal pain, nausea, vomiting, diarrhea,                 change in bowel habits, loss of appetite GU: dysuria, change in color of urine, no urgency or frequency.   flank pain. MS:   joint pain, stiffness, decreased range of motion, back pain. Neuro-     nothing unusual Psych:  change in mood or affect.  depression or anxiety.   memory loss.  OBJ- Physical Exam General- Alert, Oriented, Affect-appropriate, Distress- none acute Skin- rash-none, lesions- none, excoriation- none Lymphadenopathy- none Head- atraumatic            Eyes- Gross vision intact, PERRLA, conjunctivae and secretions clear            Ears- Hearing, canals-normal            Nose- Clear, no-Septal dev, mucus, polyps, erosion, perforation             Throat- Mallampati III, mucosa clear , drainage- none, tonsils- atrophic, + teeth Neck- flexible , trachea midline, no stridor , thyroid nl, carotid no bruit Chest - symmetrical  excursion , unlabored           Heart/CV- RRR , no murmur , no gallop  , no rub, nl s1 s2                           - JVD- none , edema- none, stasis changes- none, varices- none           Lung- clear to P&A, wheeze- none, cough- none , dullness-none, rub- none           Chest wall-  Abd- + ileostomy bag Br/ Gen/ Rectal- Not done, not indicated Extrem- cyanosis- none, clubbing, none, atrophy- none, strength- nl Neuro- grossly intact to observation

## 2023-01-11 ENCOUNTER — Encounter: Payer: Self-pay | Admitting: Internal Medicine

## 2023-01-11 ENCOUNTER — Ambulatory Visit (INDEPENDENT_AMBULATORY_CARE_PROVIDER_SITE_OTHER): Payer: Medicare Other | Admitting: Internal Medicine

## 2023-01-11 VITALS — BP 132/84 | HR 77 | Ht 69.0 in | Wt 198.2 lb

## 2023-01-11 DIAGNOSIS — G4733 Obstructive sleep apnea (adult) (pediatric): Secondary | ICD-10-CM | POA: Diagnosis not present

## 2023-01-11 DIAGNOSIS — I700 Atherosclerosis of aorta: Secondary | ICD-10-CM | POA: Diagnosis not present

## 2023-01-11 NOTE — Patient Instructions (Addendum)
We can continue CPAP  auto 5-20  Glad you are doing well. Please call if we can help  Order- printed script for CPAP mask of choice, humidifier, hoses, filters, supplies

## 2023-01-13 ENCOUNTER — Encounter: Payer: Self-pay | Admitting: Family Medicine

## 2023-01-13 ENCOUNTER — Ambulatory Visit (INDEPENDENT_AMBULATORY_CARE_PROVIDER_SITE_OTHER): Payer: Medicare Other | Admitting: Family Medicine

## 2023-01-13 VITALS — BP 110/74 | HR 84 | Temp 96.1°F | Resp 16 | Ht 69.0 in | Wt 196.2 lb

## 2023-01-13 DIAGNOSIS — I700 Atherosclerosis of aorta: Secondary | ICD-10-CM

## 2023-01-13 DIAGNOSIS — R7301 Impaired fasting glucose: Secondary | ICD-10-CM | POA: Diagnosis not present

## 2023-01-13 DIAGNOSIS — N400 Enlarged prostate without lower urinary tract symptoms: Secondary | ICD-10-CM | POA: Diagnosis not present

## 2023-01-13 DIAGNOSIS — E782 Mixed hyperlipidemia: Secondary | ICD-10-CM | POA: Diagnosis not present

## 2023-01-13 DIAGNOSIS — Z932 Ileostomy status: Secondary | ICD-10-CM

## 2023-01-13 DIAGNOSIS — K5180 Other ulcerative colitis without complications: Secondary | ICD-10-CM

## 2023-01-13 DIAGNOSIS — G4733 Obstructive sleep apnea (adult) (pediatric): Secondary | ICD-10-CM | POA: Diagnosis not present

## 2023-01-13 NOTE — Assessment & Plan Note (Signed)
No problems 

## 2023-01-13 NOTE — Assessment & Plan Note (Signed)
Compliant with cpap. Benefits from CPAP.  

## 2023-01-13 NOTE — Progress Notes (Signed)
Subjective:  Patient ID: Tyler Giles, male    DOB: [birth year]  Age: 72 y.o. MRN: [id number]  Chief Complaint  Patient presents with   Medical Management of Chronic Issues    HPI Hyperlipidemia/aortic atherosclerosis:  Patient is currently taking rosuvastatin 5 mg daily.  Aspirin 81 mg daily. Patient eats healthy.  Patient takes coenzyme every 10.  Takes fish oil 1000 mg 1 p.o. daily.  Obstructive Sleep Apnea: On cpap. Met with Dr. Maple Hudson and got a good report.   BPH: Had TURP in November and doing very well.    Prediabetes: Currently on metformin XR 500 mg once daily  Eats healthy and exercises. Has an colostomy. No problems with it.   Cardiovascular work up due to bradycardia and was found to be due to PVXs.      01/13/2023    7:31 AM 07/14/2022   11:18 AM 07/01/2021    8:21 AM 06/29/2020    8:13 PM 12/26/2019    9:13 AM  Depression screen PHQ 2/9  Decreased Interest 0 0 0 0 0  Down, Depressed, Hopeless 0 0 0 0 0  PHQ - 2 Score 0 0 0 0 0        01/13/2023    7:30 AM  Fall Risk   Falls in the past year? 0  Number falls in past yr: 0  Injury with Fall? 0  Risk for fall due to : No Fall Risks  Follow up Falls evaluation completed;Falls prevention discussed    Patient Care Team: Ragnar Waas, Fritzi Mandes, MD as PCP - General (Family Medicine)   Review of Systems  Constitutional:  Negative for chills and fever.  HENT:  Negative for congestion, rhinorrhea and sore throat.   Respiratory:  Negative for cough and shortness of breath.   Cardiovascular:  Negative for chest pain and palpitations.  Gastrointestinal:  Negative for abdominal pain, constipation, diarrhea, nausea and vomiting.  Genitourinary:  Negative for dysuria and urgency.  Musculoskeletal:  Negative for back pain and myalgias.  Neurological:  Negative for dizziness and headaches.  Psychiatric/Behavioral:  Negative for dysphoric mood. The patient is not nervous/anxious.    Current Outpatient Medications on File  Prior to Visit  Medication Sig Dispense Refill   aspirin 81 MG EC tablet Take 81 mg by mouth at bedtime.     celecoxib (CELEBREX) 200 MG capsule TAKE 1 CAPSULE BY MOUTH EVERY DAY (Patient not taking: Reported on 01/11/2023) 90 capsule 3   Coenzyme Q10 (COQ10) 150 MG CAPS Take 1 capsule by mouth daily at 12 noon.     finasteride (PROSCAR) 5 MG tablet TAKE 1 TABLET BY MOUTH ONCE DAILY (Patient taking differently: Take 5 mg by mouth at bedtime.) 90 tablet 1   metFORMIN (GLUCOPHAGE-XR) 500 MG 24 hr tablet TAKE 1 TABLET BY MOUTH TWICE A DAY (Patient taking differently: Take 500 mg by mouth 2 (two) times daily with a meal.) 180 tablet 3   Multiple Vitamin (MULTIVITAMIN) capsule Take 1 capsule by mouth daily.     Omega-3 Fatty Acids (FISH OIL) 1000 MG CAPS Take 1 capsule by mouth daily.     rosuvastatin (CRESTOR) 10 MG tablet Take 1 tablet (10 mg total) by mouth daily. (Patient taking differently: Take 5 mg by mouth at bedtime.) 90 tablet 1   zinc gluconate 50 MG tablet Take 50 mg by mouth daily as needed.     No current facility-administered medications on file prior to visit.   Past Medical History:  Diagnosis Date  Benign localized prostatic hyperplasia with lower urinary tract symptoms (LUTS)    DDD (degenerative disc disease), cervical    ED (erectile dysfunction)    Ileostomy in place [facility]) 1991   s/p coloprototectomy for UC   Mixed hyperlipidemia    OA (osteoarthritis)    OSA on CPAP    uses nightly per pt   (followed by dr c young)   Peyronie's disease 2019   completed treatment w/ injections 03/ 2021 by dr Vernie Ammons   Pre-diabetes    followed by pcp    (07-28-2022  per pt checks blood sugar 3-4 times daily, stated last A1c  5.6,  takes metformin)   UC (ulcerative colitis) (HCC)    s/p  completed colectomy w/ ileostomy 1991   Weak urinary stream    Past Surgical History:  Procedure Laterality Date   CATARACT EXTRACTION W/ INTRAOCULAR LENS IMPLANT Bilateral 2020   COLOPROCTECTOMY W/  ILEO J POUCH  1991   for UC  (ileostomy RLQ)  w/ appendectomy   INGUINAL HERNIA REPAIR  2002   KNEE ARTHROSCOPY Left    x3  last one 2009   REPLACEMENT UNICONDYLAR JOINT KNEE Left 09/27/2008   @[facility] [place] by dr Lamar Sprinkles   REVISION TOTAL KNEE ARTHROPLASTY Left 09/09/2020   @[facility] [place] by dr shields;   Uni partial TK to TKA   TONSILLECTOMY     age 20   TRANSURETHRAL RESECTION OF PROSTATE N/A 08/02/2022   Procedure: TRANSURETHRAL RESECTION OF THE PROSTATE (TURP), BIPOLAR;  Surgeon: Jannifer Hick, MD;  Location: [facility];  Service: Urology;  Laterality: N/A;    Family History  Problem Relation Age of Onset   Cerebrovascular Accident Mother    Acute myelogenous leukemia Father    Prostate cancer Father    Coronary artery disease Maternal Uncle    Coronary artery disease Maternal Grandmother    Stroke Paternal Grandmother    Stroke Paternal Grandfather    Social History   Socioeconomic History   Marital status: Married    Spouse name: Not on file   Number of children: 2   Years of education: Not on file   Highest education level: Master's degree (e.g., MA, MS, MEng, MEd, MSW, MBA)  Occupational History   Not on file  Tobacco Use   Smoking status: Never   Smokeless tobacco: Never  Vaping Use   Vaping Use: Never used  Substance and Sexual Activity   Alcohol use: Yes    Alcohol/week: 7.0 standard drinks of alcohol    Types: 7 Glasses of wine per week    Comment: one glass wine dailiy   Drug use: Never   Sexual activity: Yes  Other Topics Concern   Not on file  Social History Narrative   Not on file   Social Determinants of Health   Financial Resource Strain: Low Risk  (01/12/2023)   Overall Financial Resource Strain (CARDIA)    Difficulty of Paying Living Expenses: Not hard at all  Food Insecurity: No Food Insecurity (01/12/2023)   Hunger Vital Sign    Worried About Running Out of Food in the Last Year: Never true    Ran Out of Food in the Last  Year: Never true  Transportation Needs: No Transportation Needs (01/12/2023)   PRAPARE - Administrator, Civil Service (Medical): No    Lack of Transportation (Non-Medical): No  Physical Activity: Insufficiently Active (01/12/2023)   Exercise Vital Sign    Days of Exercise per  Week: 7 days    Minutes of Exercise per Session: 20 min  Stress: No Stress Concern Present (01/12/2023)   [employer] of Occupational Health - Occupational Stress Questionnaire    Feeling of Stress : Not at all  Social Connections: Socially Integrated (01/12/2023)   Social Connection and Isolation Panel [NHANES]    Frequency of Communication with Friends and Family: More than three times a week    Frequency of Social Gatherings with Friends and Family: Twice a week    Attends Religious Services: More than 4 times per year    Active Member of [employer] or Organizations: Yes    Attends Engineer, structural: More than 4 times per year    Marital Status: Married    Objective:  BP 110/74   Pulse 84   Temp (!) 96.1 F (35.6 C)   Resp 16   Ht 5\' 9"  (1.753 m)   Wt 196 lb 3.2 oz (89 kg)   BMI 28.97 kg/m      01/13/2023    7:27 AM 01/11/2023    2:29 PM 08/03/2022    9:00 AM  BP/Weight  Systolic BP 110 132 124  Diastolic BP 74 84 82  Wt. (Lbs) 196.2 198.2   BMI 28.97 kg/m2 29.27 kg/m2     Physical Exam Vitals reviewed.  Constitutional:      Appearance: Normal appearance.  Neck:     Vascular: No carotid bruit.  Cardiovascular:     Rate and Rhythm: Normal rate and regular rhythm.     Heart sounds: Normal heart sounds.  Pulmonary:     Effort: Pulmonary effort is normal.     Breath sounds: Normal breath sounds. No wheezing, rhonchi or rales.  Neurological:     Mental Status: He is alert and oriented to person, place, and time.  Psychiatric:        Mood and Affect: Mood normal.        Behavior: Behavior normal.     Diabetic Foot Exam - Simple   No data filed      Lab Results   Component Value Date   WBC 11.5 (H) 08/03/2022   HGB 14.2 08/03/2022   HCT 43.2 08/03/2022   PLT 199 08/03/2022   GLUCOSE 120 (H) 08/03/2022   CHOL 163 07/14/2022   TRIG 96 07/14/2022   HDL 60 07/14/2022   LDLCALC 85 07/14/2022   ALT 15 07/14/2022   AST 21 07/14/2022   NA 138 08/03/2022   K 4.6 08/03/2022   CL 107 08/03/2022   CREATININE 0.87 08/03/2022   BUN 15 08/03/2022   CO2 26 08/03/2022   TSH 1.500 07/14/2022   HGBA1C 5.6 07/14/2022      Assessment & Plan:    Mixed hyperlipidemia Assessment & Plan: Well controlled.  No changes to medicines. Continue rosuvastatin 5 mg daily.  Aspirin 81 mg daily, coenzyme q10,  fish oil 1000 mg 1 p.o. daily. Continue to work on eating a healthy diet and exercise.  Labs drawn today.    Orders: -     CBC with Differential/Platelet -     Lipid panel  Benign prostatic hyperplasia without lower urinary tract symptoms Assessment & Plan: Improved with turp.  Check psa  Orders: -     PSA  Impaired fasting glucose Assessment & Plan: Recommend continue to work on eating healthy diet and exercise. Check A1c. Continue metformin xr 500 mg daily.   Orders: -     Comprehensive metabolic panel -  Hemoglobin A1c  OSA (obstructive sleep apnea) Assessment & Plan: Compliant with cpap. Benefits from CPAP.   Orders: -     CBC with Differential/Platelet  Other ulcerative colitis without complication (HCC) Assessment & Plan: Stable. Colostomy - no problems.   Aortic atherosclerosis (HCC)  Ileostomy in place [facility]) Assessment & Plan: No problems.      No orders of the defined types were placed in this encounter.   Orders Placed This Encounter  Procedures   CBC with Differential/Platelet   Comprehensive metabolic panel   Hemoglobin A1c   Lipid panel   PSA     Follow-up: Return in about 6 months (around 07/16/2023) for awv, chronic follow up, lab visit.  I,Marla I Leal-Borjas,acting as a scribe for Blane Ohara,  MD.,have documented all relevant documentation on the behalf of Blane Ohara, MD,as directed by  Blane Ohara, MD while in the presence of Blane Ohara, MD.   An After Visit Summary was printed and given to the patient.  Blane Ohara, MD [facility] [phone]

## 2023-01-13 NOTE — Assessment & Plan Note (Signed)
Stable. Colostomy - no problems.

## 2023-01-13 NOTE — Assessment & Plan Note (Signed)
Improved with turp.  Check psa

## 2023-01-13 NOTE — Assessment & Plan Note (Signed)
Recommend continue to work on eating healthy diet and exercise. Check A1c. Continue metformin xr 500 mg daily.

## 2023-01-13 NOTE — Assessment & Plan Note (Signed)
Well controlled.  No changes to medicines. Continue rosuvastatin 5 mg daily.  Aspirin 81 mg daily, coenzyme q10,  fish oil 1000 mg 1 p.o. daily. Continue to work on eating a healthy diet and exercise.  Labs drawn today.

## 2023-01-14 LAB — CBC WITH DIFFERENTIAL/PLATELET
Basophils Absolute: 0 10*3/uL (ref 0.0–0.2)
Basos: 1 %
EOS (ABSOLUTE): 0.1 10*3/uL (ref 0.0–0.4)
Eos: 2 %
Hematocrit: 47.6 % (ref 37.5–51.0)
Hemoglobin: 15.7 g/dL (ref 13.0–17.7)
Immature Grans (Abs): 0 10*3/uL (ref 0.0–0.1)
Immature Granulocytes: 0 %
Lymphocytes Absolute: 0.8 10*3/uL (ref 0.7–3.1)
Lymphs: 15 %
MCH: 30.8 pg (ref 26.6–33.0)
MCHC: 33 g/dL (ref 31.5–35.7)
MCV: 93 fL (ref 79–97)
Monocytes Absolute: 0.4 10*3/uL (ref 0.1–0.9)
Monocytes: 8 %
Neutrophils Absolute: 3.8 10*3/uL (ref 1.4–7.0)
Neutrophils: 74 %
Platelets: 208 10*3/uL (ref 150–450)
RBC: 5.1 x10E6/uL (ref 4.14–5.80)
RDW: 14 % (ref 11.6–15.4)
WBC: 5.1 10*3/uL (ref 3.4–10.8)

## 2023-01-14 LAB — LIPID PANEL
Chol/HDL Ratio: 3.7 ratio (ref 0.0–5.0)
Cholesterol, Total: 191 mg/dL (ref 100–199)
HDL: 51 mg/dL (ref >39)
LDL Chol Calc (NIH): 119 mg/dL — ABNORMAL HIGH (ref 0–99)
Triglycerides: 115 mg/dL (ref 0–149)
VLDL Cholesterol Cal: 21 mg/dL (ref 5–40)

## 2023-01-14 LAB — COMPREHENSIVE METABOLIC PANEL
ALT: 11 IU/L (ref 0–44)
AST: 22 IU/L (ref 0–40)
Albumin/Globulin Ratio: 1.9 (ref 1.2–2.2)
Albumin: 4.3 g/dL (ref 3.8–4.8)
Alkaline Phosphatase: 82 IU/L (ref 44–121)
BUN/Creatinine Ratio: 21 (ref 10–24)
BUN: 24 mg/dL (ref 8–27)
Bilirubin Total: 0.5 mg/dL (ref 0.0–1.2)
CO2: 20 mmol/L (ref 20–29)
Calcium: 9.4 mg/dL (ref 8.6–10.2)
Chloride: 101 mmol/L (ref 96–106)
Creatinine, Ser: 1.17 mg/dL (ref 0.76–1.27)
Globulin, Total: 2.3 g/dL (ref 1.5–4.5)
Glucose: 96 mg/dL (ref 70–99)
Potassium: 4.6 mmol/L (ref 3.5–5.2)
Sodium: 138 mmol/L (ref 134–144)
Total Protein: 6.6 g/dL (ref 6.0–8.5)
eGFR: 67 mL/min/{1.73_m2} (ref >59)

## 2023-01-14 LAB — CARDIOVASCULAR RISK ASSESSMENT

## 2023-01-14 LAB — HEMOGLOBIN A1C
Est. average glucose Bld gHb Est-mCnc: 120 mg/dL
Hgb A1c MFr Bld: 5.8 % — ABNORMAL HIGH (ref 4.8–5.6)

## 2023-01-14 LAB — PSA: Prostate Specific Ag, Serum: 0.6 ng/mL (ref 0.0–4.0)

## 2023-01-19 ENCOUNTER — Ambulatory Visit: Payer: Medicare Other | Admitting: Family Medicine

## 2023-01-25 ENCOUNTER — Other Ambulatory Visit: Payer: Self-pay

## 2023-01-25 MED ORDER — ROSUVASTATIN CALCIUM 10 MG PO TABS: 10.0000 mg | ORAL_TABLET | Freq: Every day | ORAL | 1 refills | Status: DC

## 2023-02-02 ENCOUNTER — Encounter: Payer: Self-pay | Admitting: Internal Medicine

## 2023-02-02 NOTE — Assessment & Plan Note (Signed)
Evaluated by cardiology to address PVCs.  He denies significant ongoing issues of concern.

## 2023-02-02 NOTE — Assessment & Plan Note (Signed)
Effects from CPAP with good compliance and control. Plan-continue auto 5-20 

## 2023-02-21 ENCOUNTER — Other Ambulatory Visit: Payer: Self-pay | Admitting: Family Medicine

## 2023-03-30 ENCOUNTER — Ambulatory Visit: Payer: Medicare Other

## 2023-04-11 ENCOUNTER — Other Ambulatory Visit: Payer: Self-pay | Admitting: Family Medicine

## 2023-05-24 ENCOUNTER — Other Ambulatory Visit: Payer: Self-pay | Admitting: Family Medicine

## 2023-07-18 ENCOUNTER — Other Ambulatory Visit: Payer: BLUE CROSS/BLUE SHIELD

## 2023-07-20 ENCOUNTER — Encounter: Payer: BLUE CROSS/BLUE SHIELD | Admitting: Family Medicine

## 2023-08-09 NOTE — Progress Notes (Signed)
Subjective:   Tyler Giles is a 72 y.o. male who presents for Medicare Annual/Subsequent preventive examination.  Visit Complete: In person  Patient Medicare AWV questionnaire was completed by the patient; I have confirmed that all information answered by patient is correct and no changes since this date. History of Present Illness He reports having been unwell two weeks ago with symptoms suggestive of a viral infection, possibly COVID-19, but did not test positive. He describes the illness as having a deep impact on his bronchial tubes, resulting in a persistent cough. He has since recovered and is currently asymptomatic.  The patient also mentions a recent increase in the number of floaters in his eyes and the occurrence of optical migraines every two to three weeks. These migraines are characterized by jagged, lightning bolt-like patterns in his visual field, lasting for about five to ten minutes without any associated headache.  He has been busy with personal matters, including travel and property management. He reports a change in his alcohol consumption, having cut back significantly on his wine intake. He has replaced this with kombucha, which he consumes nearly every night.  The patient also reveals that he has been seen by a cardiologist, Dr. Judithe Modest, for a heart condition characterized by a 7% PVC ratio. He underwent a stress test, which he passed successfully. He has not been prescribed any beta blockers and has reduced his coffee intake.  He also mentions a knee condition, which has been evaluated by Dr. Dorris Carnes. He reports that he is not in significant pain, but can feel the condition progressing. He anticipates needing a knee replacement in the next year and a half.  Finally, the patient discusses a cavernoma in the right cingulate gyrus of his brain, which he believes may be a familial condition. He expresses curiosity about its location and potential  implications.  Hyperlipidemia/aortic atherosclerosis:  Patient is currently taking rosuvastatin 5 mg daily.  Aspirin 81 mg daily. Patient eats healthy.  Patient takes coenzyme every 10.  Takes fish oil 1000 mg 1 p.o. daily.   Obstructive Sleep Apnea: On cpap. Met with Dr. Maple Hudson and got a good report.    BPH: Had TURP in November and doing very well.     Prediabetes: Currently on metformin XR 500 mg once daily   Eats healthy and exercises. Has an colostomy. No problems with it.      Review of Systems  Constitutional:  Negative for chills, fever and malaise/fatigue.  HENT:  Negative for ear pain, sinus pain and sore throat.   Respiratory:  Negative for cough and shortness of breath.   Cardiovascular:  Negative for chest pain.  Musculoskeletal:  Negative for myalgias.  Neurological:  Negative for headaches.   Cardiac Risk Factors include: hypertension;dyslipidemia;advanced age (>65men, >19 women)     Objective:    Today's Vitals   08/10/23 0905  BP: 130/86  Pulse: 67  Temp: 97.8 F (36.6 C)  SpO2: 97%  Weight: 196 lb (88.9 kg)  Height: 5' 8.5" (1.74 m)  PainSc: 0-No pain   Body mass index is 29.37 kg/m.   Physical Exam Vitals reviewed.  Constitutional:      Appearance: Normal appearance.  Neck:     Vascular: No carotid bruit.  Cardiovascular:     Rate and Rhythm: Normal rate and regular rhythm.     Heart sounds: Normal heart sounds.  Pulmonary:     Effort: Pulmonary effort is normal.     Breath sounds: Normal breath sounds.  Abdominal:     General: Bowel sounds are normal.     Palpations: Abdomen is soft.     Tenderness: There is no abdominal tenderness.  Neurological:     Mental Status: He is alert and oriented to person, place, and time.  Psychiatric:        Mood and Affect: Mood normal.        Behavior: Behavior normal.      08/10/2023    9:06 AM 08/02/2022   10:11 AM 07/01/2021    8:25 AM 06/29/2020    8:16 PM  Advanced Directives  Does Patient  Have a Medical Advance Directive? Yes Yes Yes Yes  Type of Estate agent of [place];Living will Healthcare Power of [place];Living will Healthcare Power of [place];Living will Healthcare Power of Attorney  Does patient want to make changes to medical advance directive? No - Patient declined  No - Patient declined   Copy of Healthcare Power of Attorney in Chart? No - copy requested No - copy requested      Current Medications (verified) Outpatient Encounter Medications as of 08/10/2023  Medication Sig   aspirin 81 MG EC tablet Take 81 mg by mouth at bedtime.   celecoxib (CELEBREX) 200 MG capsule TAKE 1 CAPSULE BY MOUTH EVERY DAY (Patient not taking: Reported on 01/11/2023)   Coenzyme Q10 (COQ10) 150 MG CAPS Take 1 capsule by mouth daily at 12 noon.   finasteride (PROSCAR) 5 MG tablet TAKE 1 TABLET BY MOUTH ONCE DAILY (Patient taking differently: Take 5 mg by mouth at bedtime.)   metFORMIN (GLUCOPHAGE-XR) 500 MG 24 hr tablet TAKE 1 TABLET BY MOUTH TWICE A DAY   Multiple Vitamin (MULTIVITAMIN) capsule Take 1 capsule by mouth daily.   Omega-3 Fatty Acids (FISH OIL) 1000 MG CAPS Take 1 capsule by mouth daily.   zinc gluconate 50 MG tablet Take 50 mg by mouth daily as needed.   [DISCONTINUED] rosuvastatin (CRESTOR) 10 MG tablet TAKE 1 TABLET BY MOUTH EVERY DAY   No facility-administered encounter medications on file as of 08/10/2023.    Allergies (verified) Lipitor [atorvastatin]   History: Past Medical History:  Diagnosis Date   Benign localized prostatic hyperplasia with lower urinary tract symptoms (LUTS)    DDD (degenerative disc disease), cervical    ED (erectile dysfunction)    Ileostomy in place [facility]) 1991   s/p coloprototectomy for UC   Mixed hyperlipidemia    OA (osteoarthritis)    OSA on CPAP    uses nightly per pt   (followed by dr c young)   Peyronie's disease 2019   completed treatment w/ injections 03/ 2021 by dr Vernie Ammons   Pre-diabetes    followed  by pcp    (07-28-2022  per pt checks blood sugar 3-4 times daily, stated last A1c  5.6,  takes metformin)   UC (ulcerative colitis) (HCC)    s/p  completed colectomy w/ ileostomy 1991   Weak urinary stream    Past Surgical History:  Procedure Laterality Date   CATARACT EXTRACTION W/ INTRAOCULAR LENS IMPLANT Bilateral 2020   COLOPROCTECTOMY W/ ILEO J POUCH  1991   for UC  (ileostomy RLQ)  w/ appendectomy   INGUINAL HERNIA REPAIR  2002   KNEE ARTHROSCOPY Left    x3  last one 2009   REPLACEMENT UNICONDYLAR JOINT KNEE Left 09/27/2008   @[facility] [place] by dr Lamar Sprinkles   REVISION TOTAL KNEE ARTHROPLASTY Left 09/09/2020   @[facility] [place] by dr shields;   Uni  partial TK to TKA   TONSILLECTOMY     age 26   TRANSURETHRAL RESECTION OF PROSTATE N/A 08/02/2022   Procedure: TRANSURETHRAL RESECTION OF THE PROSTATE (TURP), BIPOLAR;  Surgeon: Jannifer Hick, MD;  Location: [facility];  Service: Urology;  Laterality: N/A;   Family History  Problem Relation Age of Onset   Cerebrovascular Accident Mother    Acute myelogenous leukemia Father    Prostate cancer Father    Coronary artery disease Maternal Uncle    Coronary artery disease Maternal Grandmother    Stroke Paternal Grandmother    Stroke Paternal Grandfather    Social History   Socioeconomic History   Marital status: Married    Spouse name: Not on file   Number of children: 2   Years of education: Not on file   Highest education level: Master's degree (e.g., MA, MS, MEng, MEd, MSW, MBA)  Occupational History   Not on file  Tobacco Use   Smoking status: Never   Smokeless tobacco: Never  Vaping Use   Vaping status: Never Used  Substance and Sexual Activity   Alcohol use: Yes    Alcohol/week: 7.0 standard drinks of alcohol    Types: 7 Glasses of wine per week    Comment: one glass wine dailiy   Drug use: Never   Sexual activity: Yes  Other Topics Concern   Not on file  Social History Narrative   Not on file    Social Determinants of Health   Financial Resource Strain: Low Risk  (01/12/2023)   Overall Financial Resource Strain (CARDIA)    Difficulty of Paying Living Expenses: Not hard at all  Food Insecurity: No Food Insecurity (01/12/2023)   Hunger Vital Sign    Worried About Running Out of Food in the Last Year: Never true    Ran Out of Food in the Last Year: Never true  Transportation Needs: No Transportation Needs (01/12/2023)   PRAPARE - Administrator, Civil Service (Medical): No    Lack of Transportation (Non-Medical): No  Physical Activity: Insufficiently Active (01/12/2023)   Exercise Vital Sign    Days of Exercise per Week: 7 days    Minutes of Exercise per Session: 20 min  Stress: No Stress Concern Present (01/12/2023)   [employer] of Occupational Health - Occupational Stress Questionnaire    Feeling of Stress : Not at all  Social Connections: Socially Integrated (01/12/2023)   Social Connection and Isolation Panel [NHANES]    Frequency of Communication with Friends and Family: More than three times a week    Frequency of Social Gatherings with Friends and Family: Twice a week    Attends Religious Services: More than 4 times per year    Active Member of [employer] or Organizations: Yes    Attends Engineer, structural: More than 4 times per year    Marital Status: Married    Tobacco Counseling Counseling given: Not Answered   Clinical Intake:  Pre-visit preparation completed: No  Pain : No/denies pain Pain Score: 0-No pain     Nutritional Status: BMI 25 -29 Overweight Diabetes: No  How often do you need to have someone help you when you read instructions, pamphlets, or other written materials from your doctor or pharmacy?: 1 - Never  Interpreter Needed?: No      Activities of Daily Living    08/10/2023    9:07 AM  In your present state of health, do you have any difficulty performing  the following activities:  Hearing? 0  Vision? 0   Difficulty concentrating or making decisions? 0  Walking or climbing stairs? 0  Dressing or bathing? 0  Doing errands, shopping? 0  Preparing Food and eating ? N  Using the Toilet? N  In the past six months, have you accidently leaked urine? N  Do you have problems with loss of bowel control? N  Managing your Medications? N  Managing your Finances? N  Housekeeping or managing your Housekeeping? N    Patient Care Team: CoxFritzi Mandes, MD as PCP - General (Family Medicine)  Indicate any recent Medical Services you may have received from other than [facility] providers in the past year (date may be approximate).     Assessment:   This is a routine wellness examination for [place].  Hearing/Vision screen No results found.  Depression Screen    08/10/2023    9:10 AM 01/13/2023    7:31 AM 07/14/2022   11:18 AM 07/01/2021    8:21 AM 06/29/2020    8:13 PM 12/26/2019    9:13 AM  PHQ 2/9 Scores  PHQ - 2 Score 0 0 0 0 0 0    Fall Risk    08/10/2023    9:07 AM 01/13/2023    7:30 AM 07/04/2022    3:33 PM 07/01/2021    8:21 AM 06/29/2020    8:13 PM  Fall Risk   Falls in the past year? 0 0 0 0 0  Number falls in past yr: 0 0 0 0 0  Injury with Fall? 0 0 0 0 0  Risk for fall due to : No Fall Risks No Fall Risks  No Fall Risks No Fall Risks  Follow up Falls evaluation completed Falls evaluation completed;Falls prevention discussed   Falls evaluation completed;Falls prevention discussed    MEDICARE RISK AT HOME: Medicare Risk at Home Any stairs in or around the home?: Yes If so, are there any without handrails?: Yes Home free of loose throw rugs in walkways, pet beds, electrical cords, etc?: Yes Adequate lighting in your home to reduce risk of falls?: Yes Life alert?: No Use of a cane, walker or w/c?: No Grab bars in the bathroom?: Yes Shower chair or bench in shower?: No Elevated toilet seat or a handicapped toilet?: No  TIMED UP AND GO:  Was the test performed?  Yes  Length of  time to ambulate 10 feet: 3 sec Gait steady and fast without use of assistive device    Cognitive Function:        07/01/2021    8:25 AM  6CIT Screen  What Year? 0 points  What month? 0 points  What time? 0 points  Count back from 20 0 points  Months in reverse 0 points  Repeat phrase 0 points  Total Score 0 points    Immunizations Immunization History  Administered Date(s) Administered   BCG 02/21/2020   Fluad Quad(high Dose 65+) 06/26/2020, 07/14/2022   Influenza-Unspecified 06/28/2019, 06/03/2021, 04/28/2023   PFIZER(Purple Top)SARS-COV-2 Vaccination 09/22/2019, 10/13/2019, 06/12/2020   PNEUMOCOCCAL CONJUGATE-20 12/31/2021   PPD Test 02/19/2020   Pneumococcal Conjugate-13 05/27/2015   Pneumococcal Polysaccharide-23 07/01/2010, 08/18/2016   Tdap 03/25/2014, 07/05/2018   Zoster Recombinant(Shingrix) 03/10/2021, 06/23/2021   Zoster, Live 04/03/2013    Screening Tests Health Maintenance  Topic Date Due   COVID-19 Vaccine (4 - 2023-24 season) 08/26/2023 (Originally 05/08/2023)   Medicare Annual Wellness (AWV)  08/09/2024   DTaP/Tdap/Td (3 - Td or Tdap) 07/05/2028  Pneumonia Vaccine 60+ Years old  Completed   INFLUENZA VACCINE  Completed   Hepatitis C Screening  Completed   Zoster Vaccines- Shingrix  Completed   HPV VACCINES  Aged Out   Colonoscopy  Discontinued    Health Maintenance  There are no preventive care reminders to display for this patient.   Additional Screening:  Vision Screening: Recommended annual ophthalmology exams for early detection of glaucoma and other disorders of the eye. Is the patient up to date with their annual eye exam?  Yes  Who is the provider or what is the name of the office in which the patient attends annual eye exams?  If pt is not established with a provider, would they like to be referred to a provider to establish care? No .   Dental Screening: Recommended annual dental exams for proper oral hygiene  Community Resource  Referral / Chronic Care Management: CRR required this visit?  No   CCM required this visit?  No     Plan:    Encounter for Medicare annual wellness exam Assessment & Plan: Things to do to keep yourself healthy  - Exercise at least 30-45 minutes a day, 3-4 days a week.  - Eat a low-fat diet with lots of fruits and vegetables, up to 7-9 servings per day.  - Seatbelts can save your life. Wear them always.  - Smoke detectors on every level of your home, check batteries every year.  - Eye Doctor - have an eye exam every 1-2 years  - Safe sex - if you may be exposed to STDs, use a condom.  - Alcohol -  If you drink, do it moderately, less than 2 drinks per day.  - Health Care Power of Attorney. Choose someone to speak for you if you are not able.  - Depression is common in our stressful world.If you're feeling down or losing interest in things you normally enjoy, please come in for a visit.  - Violence - If anyone is threatening or hurting you, please call immediately.    Mixed hyperlipidemia Assessment & Plan: Well controlled.  No changes to medicines. Continue rosuvastatin 5 mg daily.  Aspirin 81 mg daily, coenzyme q10,  fish oil 1000 mg 1 p.o. daily. Continue to work on eating a healthy diet and exercise.  Labs drawn today.    Orders: -     CBC with Differential/Platelet -     Comprehensive metabolic panel -     Lipid panel -     TSH  Impaired fasting glucose Assessment & Plan: Recommend continue to work on eating healthy diet and exercise. Check A1c. Continue metformin xr 500 mg daily.   Orders: -     Hemoglobin A1c  Benign prostatic hyperplasia without lower urinary tract symptoms Assessment & Plan: Had TURP in November and doing very well.     OSA (obstructive sleep apnea) Assessment & Plan: Effects from CPAP with good compliance and control Plan-continue auto 5-20   Aortic atherosclerosis [facility]) Assessment & Plan: Management per specialist, Dr Judithe Modest. The  current medical regimen is effective;  continue present plan and medications.  Taking rosuvastatin 5 mg daily.  Aspirin 81 mg daily. Patient eats healthy.  Patient takes coenzyme every 10.  Takes fish oil 1000 mg 1 p.o. daily.   Primary osteoarthritis of left knee Assessment & Plan: Continue with your current management and use Celebrex occasionally for pain relief.    Cerebral cavernoma Assessment & Plan: It is not causing any  symptoms. No specific changes to your treatment plan were discussed today.    General Health Maintenance - Blood work to be done today. - Declined COVID-19 booster vaccine. I have personally reviewed and noted the following in the patient's chart:   Medical and social history Use of alcohol, tobacco or illicit drugs  Current medications and supplements including opioid prescriptions. Patient is not currently taking opioid prescriptions. Functional ability and status Nutritional status Physical activity Advanced directives List of other physicians Hospitalizations, surgeries, and ER visits in previous 12 months Vitals Screenings to include cognitive, depression, and falls Referrals and appointments  In addition, I have reviewed and discussed with patient certain preventive protocols, quality metrics, and best practice recommendations. A written personalized care plan for preventive services as well as general preventive health recommendations were provided to patient.    I attest that I have reviewed this visit and agree with the plan scribed by my staff.   Blane Ohara, MD [facility] [phone]

## 2023-08-09 NOTE — Patient Instructions (Signed)
Health Maintenance, Male Adopting a healthy lifestyle and getting preventive care are important in promoting health and wellness. Ask your health care provider about: The right schedule for you to have regular tests and exams. Things you can do on your own to prevent diseases and keep yourself healthy. What should I know about diet, weight, and exercise? Eat a healthy diet  Eat a diet that includes plenty of vegetables, fruits, low-fat dairy products, and lean protein. Do not eat a lot of foods that are high in solid fats, added sugars, or sodium. Maintain a healthy weight Body mass index (BMI) is a measurement that can be used to identify possible weight problems. It estimates body fat based on height and weight. Your health care provider can help determine your BMI and help you achieve or maintain a healthy weight. Get regular exercise Get regular exercise. This is one of the most important things you can do for your health. Most adults should: Exercise for at least 150 minutes each week. The exercise should increase your heart rate and make you sweat (moderate-intensity exercise). Do strengthening exercises at least twice a week. This is in addition to the moderate-intensity exercise. Spend less time sitting. Even light physical activity can be beneficial. Watch cholesterol and blood lipids Have your blood tested for lipids and cholesterol at 72 years of age, then have this test every 5 years. You may need to have your cholesterol levels checked more often if: Your lipid or cholesterol levels are high. You are older than 72 years of age. You are at high risk for heart disease. What should I know about cancer screening? Many types of cancers can be detected early and may often be prevented. Depending on your health history and family history, you may need to have cancer screening at various ages. This may include screening for: Colorectal cancer. Prostate cancer. Skin cancer. Lung  cancer. What should I know about heart disease, diabetes, and high blood pressure? Blood pressure and heart disease High blood pressure causes heart disease and increases the risk of stroke. This is more likely to develop in people who have high blood pressure readings or are overweight. Talk with your health care provider about your target blood pressure readings. Have your blood pressure checked: Every 3-5 years if you are 18-39 years of age. Every year if you are 40 years old or older. If you are between the ages of 65 and 75 and are a current or former smoker, ask your health care provider if you should have a one-time screening for abdominal aortic aneurysm (AAA). Diabetes Have regular diabetes screenings. This checks your fasting blood sugar level. Have the screening done: Once every three years after age 45 if you are at a normal weight and have a low risk for diabetes. More often and at a younger age if you are overweight or have a high risk for diabetes. What should I know about preventing infection? Hepatitis B If you have a higher risk for hepatitis B, you should be screened for this virus. Talk with your health care provider to find out if you are at risk for hepatitis B infection. Hepatitis C Blood testing is recommended for: Everyone born from 1945 through 1965. Anyone with known risk factors for hepatitis C. Sexually transmitted infections (STIs) You should be screened each year for STIs, including gonorrhea and chlamydia, if: You are sexually active and are younger than 72 years of age. You are older than 72 years of age and your   health care provider tells you that you are at risk for this type of infection. Your sexual activity has changed since you were last screened, and you are at increased risk for chlamydia or gonorrhea. Ask your health care provider if you are at risk. Ask your health care provider about whether you are at high risk for HIV. Your health care provider  may recommend a prescription medicine to help prevent HIV infection. If you choose to take medicine to prevent HIV, you should first get tested for HIV. You should then be tested every 3 months for as long as you are taking the medicine. Follow these instructions at home: Alcohol use Do not drink alcohol if your health care provider tells you not to drink. If you drink alcohol: Limit how much you have to 0-2 drinks a day. Know how much alcohol is in your drink. In the [place] one drink equals one 12 oz bottle of beer (355 mL), one 5 oz glass of wine (148 mL), or one 1 oz glass of hard liquor (44 mL). Lifestyle Do not use any products that contain nicotine or tobacco. These products include cigarettes, chewing tobacco, and vaping devices, such as e-cigarettes. If you need help quitting, ask your health care provider. Do not use street drugs. Do not share needles. Ask your health care provider for help if you need support or information about quitting drugs. General instructions Schedule regular health, dental, and eye exams. Stay current with your vaccines. Tell your health care provider if: You often feel depressed. You have ever been abused or do not feel safe at home. Summary Adopting a healthy lifestyle and getting preventive care are important in promoting health and wellness. Follow your health care provider's instructions about healthy diet, exercising, and getting tested or screened for diseases. Follow your health care provider's instructions on monitoring your cholesterol and blood pressure. This information is not intended to replace advice given to you by your health care provider. Make sure you discuss any questions you have with your health care provider. Document Revised: 01/12/2021 Document Reviewed: 01/12/2021 Elsevier Patient Education  2024 Elsevier Inc.  

## 2023-08-10 ENCOUNTER — Encounter: Payer: Self-pay | Admitting: Family Medicine

## 2023-08-10 ENCOUNTER — Ambulatory Visit: Payer: Medicare Other | Admitting: Family Medicine

## 2023-08-10 VITALS — BP 130/86 | HR 67 | Temp 97.8°F | Ht 68.5 in | Wt 196.0 lb

## 2023-08-10 DIAGNOSIS — I700 Atherosclerosis of aorta: Secondary | ICD-10-CM

## 2023-08-10 DIAGNOSIS — Z0000 Encounter for general adult medical examination without abnormal findings: Secondary | ICD-10-CM

## 2023-08-10 DIAGNOSIS — R7301 Impaired fasting glucose: Secondary | ICD-10-CM

## 2023-08-10 DIAGNOSIS — M1712 Unilateral primary osteoarthritis, left knee: Secondary | ICD-10-CM

## 2023-08-10 DIAGNOSIS — E782 Mixed hyperlipidemia: Secondary | ICD-10-CM

## 2023-08-10 DIAGNOSIS — G4733 Obstructive sleep apnea (adult) (pediatric): Secondary | ICD-10-CM | POA: Diagnosis not present

## 2023-08-10 DIAGNOSIS — N400 Enlarged prostate without lower urinary tract symptoms: Secondary | ICD-10-CM | POA: Diagnosis not present

## 2023-08-10 DIAGNOSIS — Q283 Other malformations of cerebral vessels: Secondary | ICD-10-CM

## 2023-08-11 LAB — COMPREHENSIVE METABOLIC PANEL
ALT: 13 [IU]/L (ref 0–44)
AST: 22 [IU]/L (ref 0–40)
Albumin: 4.4 g/dL (ref 3.8–4.8)
Alkaline Phosphatase: 75 [IU]/L (ref 44–121)
BUN/Creatinine Ratio: 16 (ref 10–24)
BUN: 18 mg/dL (ref 8–27)
Bilirubin Total: 0.7 mg/dL (ref 0.0–1.2)
CO2: 24 mmol/L (ref 20–29)
Calcium: 9.9 mg/dL (ref 8.6–10.2)
Chloride: 104 mmol/L (ref 96–106)
Creatinine, Ser: 1.14 mg/dL (ref 0.76–1.27)
Globulin, Total: 2.4 g/dL (ref 1.5–4.5)
Glucose: 93 mg/dL (ref 70–99)
Potassium: 5.1 mmol/L (ref 3.5–5.2)
Sodium: 140 mmol/L (ref 134–144)
Total Protein: 6.8 g/dL (ref 6.0–8.5)
eGFR: 68 mL/min/{1.73_m2} (ref >59)

## 2023-08-11 LAB — CBC WITH DIFFERENTIAL/PLATELET
Basophils Absolute: 0 10*3/uL (ref 0.0–0.2)
Basos: 1 %
EOS (ABSOLUTE): 0.1 10*3/uL (ref 0.0–0.4)
Eos: 3 %
Hematocrit: 47.5 % (ref 37.5–51.0)
Hemoglobin: 15.8 g/dL (ref 13.0–17.7)
Immature Grans (Abs): 0 10*3/uL (ref 0.0–0.1)
Immature Granulocytes: 1 %
Lymphocytes Absolute: 0.9 10*3/uL (ref 0.7–3.1)
Lymphs: 16 %
MCH: 30.6 pg (ref 26.6–33.0)
MCHC: 33.3 g/dL (ref 31.5–35.7)
MCV: 92 fL (ref 79–97)
Monocytes Absolute: 0.4 10*3/uL (ref 0.1–0.9)
Monocytes: 7 %
Neutrophils Absolute: 4.1 10*3/uL (ref 1.4–7.0)
Neutrophils: 72 %
Platelets: 215 10*3/uL (ref 150–450)
RBC: 5.16 x10E6/uL (ref 4.14–5.80)
RDW: 13.1 % (ref 11.6–15.4)
WBC: 5.6 10*3/uL (ref 3.4–10.8)

## 2023-08-11 LAB — LIPID PANEL
Chol/HDL Ratio: 3.4 {ratio} (ref 0.0–5.0)
Cholesterol, Total: 176 mg/dL (ref 100–199)
HDL: 52 mg/dL (ref >39)
LDL Chol Calc (NIH): 110 mg/dL — ABNORMAL HIGH (ref 0–99)
Triglycerides: 76 mg/dL (ref 0–149)
VLDL Cholesterol Cal: 14 mg/dL (ref 5–40)

## 2023-08-11 LAB — HEMOGLOBIN A1C
Est. average glucose Bld gHb Est-mCnc: 123 mg/dL
Hgb A1c MFr Bld: 5.9 % — ABNORMAL HIGH (ref 4.8–5.6)

## 2023-08-11 LAB — TSH: TSH: 1.12 u[IU]/mL (ref 0.450–4.500)

## 2023-08-12 ENCOUNTER — Other Ambulatory Visit: Payer: Self-pay

## 2023-08-12 DIAGNOSIS — E782 Mixed hyperlipidemia: Secondary | ICD-10-CM

## 2023-08-12 MED ORDER — ROSUVASTATIN CALCIUM 20 MG PO TABS: 20.0000 mg | ORAL_TABLET | Freq: Every day | ORAL | 0 refills | Status: DC

## 2023-08-13 DIAGNOSIS — Q283 Other malformations of cerebral vessels: Secondary | ICD-10-CM | POA: Insufficient documentation

## 2023-08-13 NOTE — Assessment & Plan Note (Addendum)
Management per specialist, Dr Judithe Modest. The current medical regimen is effective;  continue present plan and medications.  Taking rosuvastatin 5 mg daily.  Aspirin 81 mg daily. Patient eats healthy.  Patient takes coenzyme every 10.  Takes fish oil 1000 mg 1 p.o. daily.

## 2023-08-13 NOTE — Assessment & Plan Note (Signed)
Well controlled.  No changes to medicines. Continue rosuvastatin 5 mg daily.  Aspirin 81 mg daily, coenzyme q10,  fish oil 1000 mg 1 p.o. daily. Continue to work on eating a healthy diet and exercise.  Labs drawn today.

## 2023-08-13 NOTE — Assessment & Plan Note (Signed)
Had TURP in November and doing very well.

## 2023-08-13 NOTE — Assessment & Plan Note (Signed)

## 2023-08-13 NOTE — Assessment & Plan Note (Signed)
Recommend continue to work on eating healthy diet and exercise. Check A1c. Continue metformin xr 500 mg daily.

## 2023-08-13 NOTE — Assessment & Plan Note (Signed)
Effects from CPAP with good compliance and control. Plan-continue auto 5-20

## 2023-08-13 NOTE — Assessment & Plan Note (Signed)
Continue with your current management and use Celebrex occasionally for pain relief.

## 2023-08-13 NOTE — Assessment & Plan Note (Signed)
It is not causing any symptoms. No specific changes to your treatment plan were discussed today.

## 2023-08-24 ENCOUNTER — Ambulatory Visit: Payer: Medicare Other | Admitting: Family Medicine

## 2023-08-24 ENCOUNTER — Encounter: Payer: Self-pay | Admitting: Family Medicine

## 2023-08-24 VITALS — BP 142/98 | HR 70 | Temp 97.8°F | Ht 68.5 in | Wt 196.0 lb

## 2023-08-24 DIAGNOSIS — J069 Acute upper respiratory infection, unspecified: Secondary | ICD-10-CM | POA: Insufficient documentation

## 2023-08-24 DIAGNOSIS — R0989 Other specified symptoms and signs involving the circulatory and respiratory systems: Secondary | ICD-10-CM | POA: Insufficient documentation

## 2023-08-24 DIAGNOSIS — J029 Acute pharyngitis, unspecified: Secondary | ICD-10-CM | POA: Insufficient documentation

## 2023-08-24 LAB — POCT RAPID STREP A (OFFICE): Rapid Strep A Screen: NEGATIVE

## 2023-08-24 NOTE — Assessment & Plan Note (Signed)
POC Strep test: negative.

## 2023-08-24 NOTE — Progress Notes (Signed)
Acute Office Visit  Subjective:    Patient ID: Tyler Giles, male    DOB: 1951-05-04, 72 y.o.   MRN: [id number]  Chief Complaint  Patient presents with   Chest congestion     1-2 weeks   Discussed the use of AI scribe software for clinical note transcription with the patient, who gave verbal consent to proceed.  HPI: He reports feeling worse two days ago, but has since been improving and sleeping better. He describes a productive cough and chest pressure, but denies shortness of breath, fever, and body aches. He has been taking Mucinex and cough medication for his symptoms. He has been physically active, working in the yard. He has a history of pneumonia and is aware of when he has chest symptoms. He also reports a rough throat last week, but denies soreness. He tested negative for COVID-19. He has not been around anyone known to be sick recently. He denies any recent changes in bowel habits, noting that he has an ileostomy.  Past Medical History:  Diagnosis Date   Benign localized prostatic hyperplasia with lower urinary tract symptoms (LUTS)    DDD (degenerative disc disease), cervical    ED (erectile dysfunction)    Ileostomy in place [facility]) 1991   s/p coloprototectomy for UC   Mixed hyperlipidemia    OA (osteoarthritis)    OSA on CPAP    uses nightly per pt   (followed by dr c young)   Peyronie's disease 2019   completed treatment w/ injections 03/ 2021 by dr Vernie Ammons   Pre-diabetes    followed by pcp    (07-28-2022  per pt checks blood sugar 3-4 times daily, stated last A1c  5.6,  takes metformin)   UC (ulcerative colitis) (HCC)    s/p  completed colectomy w/ ileostomy 1991   Weak urinary stream     Past Surgical History:  Procedure Laterality Date   CATARACT EXTRACTION W/ INTRAOCULAR LENS IMPLANT Bilateral 2020   COLOPROCTECTOMY W/ ILEO J POUCH  1991   for UC  (ileostomy RLQ)  w/ appendectomy   INGUINAL HERNIA REPAIR  2002   KNEE ARTHROSCOPY Left    x3  last one  2009   REPLACEMENT UNICONDYLAR JOINT KNEE Left 09/27/2008   @[facility] [place] by dr Lamar Sprinkles   REVISION TOTAL KNEE ARTHROPLASTY Left 09/09/2020   @[facility] [place] by dr shields;   Uni partial TK to TKA   TONSILLECTOMY     age 89   TRANSURETHRAL RESECTION OF PROSTATE N/A 08/02/2022   Procedure: TRANSURETHRAL RESECTION OF THE PROSTATE (TURP), BIPOLAR;  Surgeon: Jannifer Hick, MD;  Location: [facility];  Service: Urology;  Laterality: N/A;    Family History  Problem Relation Age of Onset   Cerebrovascular Accident Mother    Acute myelogenous leukemia Father    Prostate cancer Father    Coronary artery disease Maternal Uncle    Coronary artery disease Maternal Grandmother    Stroke Paternal Grandmother    Stroke Paternal Grandfather     Social History   Socioeconomic History   Marital status: Married    Spouse name: Not on file   Number of children: 2   Years of education: Not on file   Highest education level: Master's degree (e.g., MA, MS, MEng, MEd, MSW, MBA)  Occupational History   Not on file  Tobacco Use   Smoking status: Never   Smokeless tobacco: Never  Vaping Use   Vaping status: Never Used  Substance and Sexual Activity   Alcohol use: Yes    Alcohol/week: 7.0 standard drinks of alcohol    Types: 7 Glasses of wine per week    Comment: one glass wine dailiy   Drug use: Never   Sexual activity: Yes  Other Topics Concern   Not on file  Social History Narrative   Not on file   Social Drivers of Health   Financial Resource Strain: Low Risk  (08/23/2023)   Overall Financial Resource Strain (CARDIA)    Difficulty of Paying Living Expenses: Not hard at all  Food Insecurity: No Food Insecurity (08/23/2023)   Hunger Vital Sign    Worried About Running Out of Food in the Last Year: Never true    Ran Out of Food in the Last Year: Never true  Transportation Needs: No Transportation Needs (08/23/2023)   PRAPARE - Administrator, Civil Service  (Medical): No    Lack of Transportation (Non-Medical): No  Physical Activity: Insufficiently Active (08/23/2023)   Exercise Vital Sign    Days of Exercise per Week: 5 days    Minutes of Exercise per Session: 20 min  Stress: No Stress Concern Present (08/23/2023)   [employer] of Occupational Health - Occupational Stress Questionnaire    Feeling of Stress : Not at all  Social Connections: Socially Integrated (08/23/2023)   Social Connection and Isolation Panel [NHANES]    Frequency of Communication with Friends and Family: More than three times a week    Frequency of Social Gatherings with Friends and Family: More than three times a week    Attends Religious Services: More than 4 times per year    Active Member of [employer] or Organizations: Yes    Attends Engineer, structural: More than 4 times per year    Marital Status: Married  Catering manager Violence: Not At Risk (08/10/2023)   Humiliation, Afraid, Rape, and Kick questionnaire    Fear of Current or Ex-Partner: No    Emotionally Abused: No    Physically Abused: No    Sexually Abused: No    Outpatient Medications Prior to Visit  Medication Sig Dispense Refill   aspirin 81 MG EC tablet Take 81 mg by mouth at bedtime.     Coenzyme Q10 (COQ10) 150 MG CAPS Take 1 capsule by mouth daily at 12 noon.     finasteride (PROSCAR) 5 MG tablet TAKE 1 TABLET BY MOUTH ONCE DAILY (Patient taking differently: Take 5 mg by mouth at bedtime.) 90 tablet 1   metFORMIN (GLUCOPHAGE-XR) 500 MG 24 hr tablet TAKE 1 TABLET BY MOUTH TWICE A DAY 180 tablet 1   Multiple Vitamin (MULTIVITAMIN) capsule Take 1 capsule by mouth daily.     Omega-3 Fatty Acids (FISH OIL) 1000 MG CAPS Take 1 capsule by mouth daily.     rosuvastatin (CRESTOR) 5 MG tablet Take 2.5 mg by mouth daily.     zinc gluconate 50 MG tablet Take 50 mg by mouth daily as needed.     celecoxib (CELEBREX) 200 MG capsule TAKE 1 CAPSULE BY MOUTH EVERY DAY (Patient not taking: Reported on  08/24/2023) 90 capsule 3   rosuvastatin (CRESTOR) 20 MG tablet Take 1 tablet (20 mg total) by mouth daily. 90 tablet 0   No facility-administered medications prior to visit.    Allergies  Allergen Reactions   Lipitor [Atorvastatin] Other (See Comments)    Muscle cramps    Review of Systems  Constitutional:  Negative for appetite change,  fatigue and fever.  HENT:  Positive for congestion (chest). Negative for ear pain, sinus pressure and sore throat.   Respiratory:  Positive for cough (productive). Negative for chest tightness, shortness of breath and wheezing.   Cardiovascular:  Negative for chest pain and palpitations.  Gastrointestinal:  Negative for abdominal pain, constipation, diarrhea, nausea and vomiting.  Genitourinary:  Negative for dysuria and hematuria.  Musculoskeletal:  Negative for arthralgias, back pain, joint swelling and myalgias.  Skin:  Negative for rash.  Neurological:  Negative for dizziness, weakness and headaches.  Psychiatric/Behavioral:  Negative for dysphoric mood. The patient is not nervous/anxious.        Objective:        08/24/2023    9:57 AM 08/10/2023    9:05 AM 01/13/2023    7:27 AM  Vitals with BMI  Height 5' 8.5" 5' 8.5" 5\' 9"   Weight 196 lbs 196 lbs 196 lbs 3 oz  BMI 29.36 29.36 28.96  Systolic 142 130 604  Diastolic 98 86 74  Pulse 70 67 84    Orthostatic VS for the past 72 hrs (Last 3 readings):  Patient Position BP Location  08/24/23 0957 Sitting Left Arm     Physical Exam Constitutional:      General: He is not in acute distress.    Appearance: Normal appearance. He is not ill-appearing.  HENT:     Head: Normocephalic.     Right Ear: Tympanic membrane normal.     Left Ear: Tympanic membrane normal.     Mouth/Throat:     Pharynx: Posterior oropharyngeal erythema present.  Eyes:     Conjunctiva/sclera: Conjunctivae normal.  Neck:     Vascular: No carotid bruit.  Cardiovascular:     Rate and Rhythm: Normal rate and  regular rhythm.     Heart sounds: Normal heart sounds.  Pulmonary:     Effort: Pulmonary effort is normal.     Breath sounds: Normal breath sounds. No wheezing.  Abdominal:     General: Bowel sounds are normal.     Palpations: Abdomen is soft.  Skin:    General: Skin is warm.  Neurological:     Mental Status: He is alert. Mental status is at baseline.  Psychiatric:        Mood and Affect: Mood normal.        Behavior: Behavior normal.     There are no preventive care reminders to display for this patient.  There are no preventive care reminders to display for this patient.   Lab Results  Component Value Date   TSH 1.120 08/10/2023   Lab Results  Component Value Date   WBC 5.6 08/10/2023   HGB 15.8 08/10/2023   HCT 47.5 08/10/2023   MCV 92 08/10/2023   PLT 215 08/10/2023   Lab Results  Component Value Date   NA 140 08/10/2023   K 5.1 08/10/2023   CO2 24 08/10/2023   GLUCOSE 93 08/10/2023   BUN 18 08/10/2023   CREATININE 1.14 08/10/2023   BILITOT 0.7 08/10/2023   ALKPHOS 75 08/10/2023   AST 22 08/10/2023   ALT 13 08/10/2023   PROT 6.8 08/10/2023   ALBUMIN 4.4 08/10/2023   CALCIUM 9.9 08/10/2023   ANIONGAP 5 08/03/2022   EGFR 68 08/10/2023   Lab Results  Component Value Date   CHOL 176 08/10/2023   Lab Results  Component Value Date   HDL 52 08/10/2023   Lab Results  Component Value Date   LDLCALC 110 (  H) 08/10/2023   Lab Results  Component Value Date   TRIG 76 08/10/2023   Lab Results  Component Value Date   CHOLHDL 3.4 08/10/2023   Lab Results  Component Value Date   HGBA1C 5.9 (H) 08/10/2023       Assessment & Plan:  Pharyngitis, unspecified etiology Assessment & Plan: POC Strep test - negative  Orders: -     POCT rapid strep A  Upper respiratory infection, viral Assessment & Plan: Reports wheezing, particularly with weather changes. Currently using Breztri twice daily, but has reduced to once daily due to cost. -Continue  Breztri once daily. -Consider increasing to twice daily if symptoms worsen.     No orders of the defined types were placed in this encounter.   Orders Placed This Encounter  Procedures   Rapid Strep A     Follow-up: Return if symptoms worsen or fail to improve.  An After Visit Summary was printed and given to the patient.  Total time spent on today's visit was 31 minutes, including both face-to-face time and nonface-to-face time personally spent on review of chart (labs and imaging), discussing labs and goals, discussing further work-up, treatment options, referrals to specialist if needed, reviewing outside records if pertinent, answering patient's questions, and coordinating care.    Lajuana Matte, FNP [facility] [phone]

## 2023-08-24 NOTE — Assessment & Plan Note (Signed)
Reports wheezing, particularly with weather changes. Currently using Breztri twice daily, but has reduced to once daily due to cost. -Continue Breztri once daily. -Consider increasing to twice daily if symptoms worsen.

## 2023-10-15 ENCOUNTER — Encounter: Payer: Self-pay | Admitting: Family Medicine

## 2023-10-15 ENCOUNTER — Other Ambulatory Visit: Payer: Self-pay | Admitting: Family Medicine

## 2023-10-16 ENCOUNTER — Other Ambulatory Visit: Payer: Self-pay | Admitting: Family Medicine

## 2023-10-16 MED ORDER — ROSUVASTATIN CALCIUM 20 MG PO TABS: 20.0000 mg | ORAL_TABLET | Freq: Every day | ORAL | 0 refills | Status: DC

## 2023-11-09 ENCOUNTER — Ambulatory Visit: Payer: BLUE CROSS/BLUE SHIELD | Admitting: Family Medicine

## 2023-11-09 ENCOUNTER — Encounter: Payer: Self-pay | Admitting: Family Medicine

## 2023-11-09 VITALS — BP 132/80 | HR 63 | Temp 98.2°F | Ht 68.5 in | Wt 200.0 lb

## 2023-11-09 DIAGNOSIS — N400 Enlarged prostate without lower urinary tract symptoms: Secondary | ICD-10-CM | POA: Diagnosis not present

## 2023-11-09 DIAGNOSIS — G4733 Obstructive sleep apnea (adult) (pediatric): Secondary | ICD-10-CM | POA: Diagnosis not present

## 2023-11-09 DIAGNOSIS — E782 Mixed hyperlipidemia: Secondary | ICD-10-CM

## 2023-11-09 DIAGNOSIS — M25511 Pain in right shoulder: Secondary | ICD-10-CM

## 2023-11-09 DIAGNOSIS — R7301 Impaired fasting glucose: Secondary | ICD-10-CM | POA: Diagnosis not present

## 2023-11-09 DIAGNOSIS — G8929 Other chronic pain: Secondary | ICD-10-CM

## 2023-11-09 DIAGNOSIS — M25512 Pain in left shoulder: Secondary | ICD-10-CM

## 2023-11-09 NOTE — Assessment & Plan Note (Signed)
 Bilateral shoulder pain, worse on the right, with limited internal rotation. No known injury. Possible arthritis. Currently taking Celebrex and Aleve intermittently for pain relief. -Order shoulder x-rays. -Advise patient to take Celebrex every three days for two weeks to assess for improvement. -Consider referral to specialist or steroid injection if no improvement or only temporary relief with increased Celebrex.

## 2023-11-09 NOTE — Assessment & Plan Note (Signed)
 Patient taking half of prescribed rosuvastatin dose (2.5mg ) due to concerns about impact on liver and blood glucose. Patient willing to increase to 5mg  if necessary. -Continue current rosuvastatin dose of 2.5 mg daily. -Check lipid panel.

## 2023-11-09 NOTE — Assessment & Plan Note (Signed)
 Had TURP in November 2023. Check psa

## 2023-11-09 NOTE — Assessment & Plan Note (Signed)
Recommend continue to work on eating healthy diet and exercise. Check A1c. Continue metformin xr 500 mg daily.

## 2023-11-09 NOTE — Progress Notes (Signed)
 Subjective:  Patient ID: Tyler Giles Reasons, male    DOB: [birth year]  Age: 73 y.o. MRN: [id number]  Chief Complaint  Patient presents with   Medical Management of Chronic Issues   Discussed the use of AI scribe software for clinical note transcription with the patient, who gave verbal consent to proceed.  History of Present Illness   The patient, with a history of pvc's and sleep apnea managed with a CPAP machine, presents with bilateral shoulder pain, more severe on the right side. The pain is particularly noticeable when performing certain movements, such as fastening a seatbelt. The patient denies any recent injury but has been involved in some physical work related to home renovations. He suspects the pain may be due to arthritis. He manages the pain with occasional Celebrex, which he reports provides significant relief.  In addition to the shoulder pain, the patient also discussed his cholesterol management. He chose to take rosuvastatin 5 mg 1/2 tablet daily due to concerns about its impact on his liver. He reports that this lower dose still provides reasonable results. He also takes Coenzyme Q10 and fish oil supplements, which he manually breaks open before ingestion due to concerns about the gel caps not fully dissolving in the body.      Hyperlipidemia/aortic atherosclerosis:  Patient is currently taking rosuvastatin 5 mg 1/2 daily.  Aspirin 81 mg daily. Patient eats healthy.  Patient takes coenzyme Q10.  Takes fish oil 1000 mg 1 p.o. daily.  Obstructive Sleep Apnea: On cpap. Benefits and compliant.  BPH: Had TURP in November 2023 and doing very well.  Last PSA was nearly a year ago.   Prediabetes: accucheck 100-115. Currently on metformin XR 500 mg once daily.  Eats healthy and exercises. Has an colostomy. No problems with it.      11/09/2023    8:51 AM 08/10/2023    9:10 AM 01/13/2023    7:31 AM 07/14/2022   11:18 AM 07/01/2021    8:21 AM  Depression screen PHQ 2/9  Decreased  Interest 0 0 0 0 0  Down, Depressed, Hopeless 0 0 0 0 0  PHQ - 2 Score 0 0 0 0 0  Altered sleeping 0      Tired, decreased energy 0      Change in appetite 0      Feeling bad or failure about yourself  0      Trouble concentrating 0      Moving slowly or fidgety/restless 0      Suicidal thoughts 0      PHQ-9 Score 0      Difficult doing work/chores Not difficult at all            08/10/2023    9:07 AM  Fall Risk   Falls in the past year? 0  Number falls in past yr: 0  Injury with Fall? 0  Risk for fall due to : No Fall Risks  Follow up Falls evaluation completed    Patient Care Team: Blane Ohara, MD as PCP - General (Family Medicine)   Review of Systems  Constitutional:  Negative for chills, diaphoresis, fatigue and fever.  HENT:  Negative for congestion, ear pain and sore throat.   Respiratory:  Negative for cough and shortness of breath.   Cardiovascular:  Negative for chest pain and leg swelling.  Gastrointestinal:  Negative for abdominal pain, constipation, diarrhea, nausea and vomiting.  Genitourinary:  Negative for dysuria and urgency.  Musculoskeletal:  Negative for arthralgias (  BL shoulder pain) and myalgias.  Neurological:  Negative for dizziness and headaches.  Psychiatric/Behavioral:  Negative for dysphoric mood.     Current Outpatient Medications on File Prior to Visit  Medication Sig Dispense Refill   aspirin 81 MG EC tablet Take 81 mg by mouth at bedtime.     celecoxib (CELEBREX) 200 MG capsule TAKE 1 CAPSULE BY MOUTH EVERY DAY 90 capsule 3   Coenzyme Q10 (COQ10) 150 MG CAPS Take 1 capsule by mouth daily at 12 noon.     finasteride (PROSCAR) 5 MG tablet TAKE 1 TABLET BY MOUTH ONCE DAILY (Patient taking differently: Take 5 mg by mouth at bedtime.) 90 tablet 1   metFORMIN (GLUCOPHAGE-XR) 500 MG 24 hr tablet TAKE 1 TABLET BY MOUTH TWICE A DAY 180 tablet 1   Multiple Vitamin (MULTIVITAMIN) capsule Take 1 capsule by mouth daily.     Omega-3 Fatty Acids (FISH  OIL) 1000 MG CAPS Take 1 capsule by mouth daily.     rosuvastatin (CRESTOR) 20 MG tablet Take 1 tablet (20 mg total) by mouth daily. 90 tablet 0   zinc gluconate 50 MG tablet Take 50 mg by mouth daily as needed.     No current facility-administered medications on file prior to visit.   Past Medical History:  Diagnosis Date   Benign localized prostatic hyperplasia with lower urinary tract symptoms (LUTS)    DDD (degenerative disc disease), cervical    ED (erectile dysfunction)    Ground glass opacity present on imaging of lung 11/15/2021   Ileostomy in place [facility]) 1991   s/p coloprototectomy for UC   Mixed hyperlipidemia    OA (osteoarthritis)    OSA on CPAP    uses nightly per pt   (followed by dr c young)   Peyronie's disease 2019   completed treatment w/ injections 03/ 2021 by dr Vernie Ammons   Pre-diabetes    followed by pcp    (07-28-2022  per pt checks blood sugar 3-4 times daily, stated last A1c  5.6,  takes metformin)   UC (ulcerative colitis) (HCC)    s/p  completed colectomy w/ ileostomy 1991   Weak urinary stream    Past Surgical History:  Procedure Laterality Date   CATARACT EXTRACTION W/ INTRAOCULAR LENS IMPLANT Bilateral 2020   COLOPROCTECTOMY W/ ILEO J POUCH  1991   for UC  (ileostomy RLQ)  w/ appendectomy   INGUINAL HERNIA REPAIR  2002   KNEE ARTHROSCOPY Left    x3  last one 2009   REPLACEMENT UNICONDYLAR JOINT KNEE Left 09/27/2008   @[facility] [place] by dr Lamar Sprinkles   REVISION TOTAL KNEE ARTHROPLASTY Left 09/09/2020   @[facility] [place] by dr shields;   Uni partial TK to TKA   TONSILLECTOMY     age 59   TRANSURETHRAL RESECTION OF PROSTATE N/A 08/02/2022   Procedure: TRANSURETHRAL RESECTION OF THE PROSTATE (TURP), BIPOLAR;  Surgeon: Jannifer Hick, MD;  Location: [facility];  Service: Urology;  Laterality: N/A;    Family History  Problem Relation Age of Onset   Cerebrovascular Accident Mother    Acute myelogenous leukemia Father    Prostate cancer  Father    Coronary artery disease Maternal Uncle    Coronary artery disease Maternal Grandmother    Stroke Paternal Grandmother    Stroke Paternal Grandfather    Social History   Socioeconomic History   Marital status: Married    Spouse name: Not on file   Number of children: 2  Years of education: Not on file   Highest education level: Master's degree (e.g., MA, MS, MEng, MEd, MSW, MBA)  Occupational History   Not on file  Tobacco Use   Smoking status: Never   Smokeless tobacco: Never  Vaping Use   Vaping status: Never Used  Substance and Sexual Activity   Alcohol use: Yes    Alcohol/week: 7.0 standard drinks of alcohol    Types: 7 Glasses of wine per week    Comment: one glass wine dailiy   Drug use: Never   Sexual activity: Yes  Other Topics Concern   Not on file  Social History Narrative   Not on file   Social Drivers of Health   Financial Resource Strain: Low Risk  (08/23/2023)   Overall Financial Resource Strain (CARDIA)    Difficulty of Paying Living Expenses: Not hard at all  Food Insecurity: No Food Insecurity (08/23/2023)   Hunger Vital Sign    Worried About Running Out of Food in the Last Year: Never true    Ran Out of Food in the Last Year: Never true  Transportation Needs: No Transportation Needs (08/23/2023)   PRAPARE - Administrator, Civil Service (Medical): No    Lack of Transportation (Non-Medical): No  Physical Activity: Insufficiently Active (08/23/2023)   Exercise Vital Sign    Days of Exercise per Week: 5 days    Minutes of Exercise per Session: 20 min  Stress: No Stress Concern Present (08/23/2023)   [employer] of Occupational Health - Occupational Stress Questionnaire    Feeling of Stress : Not at all  Social Connections: Socially Integrated (08/23/2023)   Social Connection and Isolation Panel [NHANES]    Frequency of Communication with Friends and Family: More than three times a week    Frequency of Social Gatherings  with Friends and Family: More than three times a week    Attends Religious Services: More than 4 times per year    Active Member of [employer] or Organizations: Yes    Attends Engineer, structural: More than 4 times per year    Marital Status: Married    Objective:  BP 132/80   Pulse 63   Temp 98.2 F (36.8 C)   Ht 5' 8.5" (1.74 m)   Wt 200 lb (90.7 kg)   SpO2 96%   BMI 29.97 kg/m      11/09/2023    8:45 AM 08/24/2023    9:57 AM 08/10/2023    9:05 AM  BP/Weight  Systolic BP 132 142 130  Diastolic BP 80 98 86  Wt. (Lbs) 200 196 196  BMI 29.97 kg/m2 29.37 kg/m2 29.37 kg/m2    Physical Exam Vitals reviewed.  Constitutional:      Appearance: Normal appearance.  Neck:     Vascular: No carotid bruit.  Cardiovascular:     Rate and Rhythm: Normal rate and regular rhythm.     Heart sounds: Normal heart sounds.  Pulmonary:     Effort: Pulmonary effort is normal.     Breath sounds: Normal breath sounds. No wheezing, rhonchi or rales.  Abdominal:     General: Bowel sounds are normal.     Palpations: Abdomen is soft.     Tenderness: There is no abdominal tenderness.  Musculoskeletal:     Comments: Discomfort with Internal rotation right arm. Internal rotation of left arm normal. External rotation and abduction normal BL.   Neurological:     Mental Status: He is alert and  oriented to person, place, and time.  Psychiatric:        Mood and Affect: Mood normal.        Behavior: Behavior normal.     Diabetic Foot Exam - Simple   No data filed      Lab Results  Component Value Date   WBC 5.6 08/10/2023   HGB 15.8 08/10/2023   HCT 47.5 08/10/2023   PLT 215 08/10/2023   GLUCOSE 93 08/10/2023   CHOL 176 08/10/2023   TRIG 76 08/10/2023   HDL 52 08/10/2023   LDLCALC 110 (H) 08/10/2023   ALT 13 08/10/2023   AST 22 08/10/2023   NA 140 08/10/2023   K 5.1 08/10/2023   CL 104 08/10/2023   CREATININE 1.14 08/10/2023   BUN 18 08/10/2023   CO2 24 08/10/2023   TSH  1.120 08/10/2023   HGBA1C 5.9 (H) 08/10/2023      Assessment & Plan:  Mixed hyperlipidemia Assessment & Plan: Patient taking half of prescribed rosuvastatin dose (2.5mg ) due to concerns about impact on liver and blood glucose. Patient willing to increase to 5mg  if necessary. -Continue current rosuvastatin dose of 2.5 mg daily. -Check lipid panel.  Orders: -     CBC with Differential/Platelet -     Comprehensive metabolic panel -     Lipid panel  Impaired fasting glucose Assessment & Plan: Recommend continue to work on eating healthy diet and exercise. Check A1c. Continue metformin xr 500 mg daily.   Orders: -     Hemoglobin A1c  OSA (obstructive sleep apnea) Assessment & Plan: Continue CPAP with good compliance and control Plan-continue auto 5-20   Benign prostatic hyperplasia without lower urinary tract symptoms Assessment & Plan: Had TURP in November 2023. Check psa  Orders: -     PSA  Chronic pain of both shoulders Assessment & Plan: Bilateral shoulder pain, worse on the right, with limited internal rotation. No known injury. Possible arthritis. Currently taking Celebrex and Aleve intermittently for pain relief. -Order shoulder x-rays. -Advise patient to take Celebrex every three days for two weeks to assess for improvement. -Consider referral to specialist or steroid injection if no improvement or only temporary relief with increased Celebrex.  Orders: -     DG Shoulder Left; Future -     DG Shoulder Right; Future          No orders of the defined types were placed in this encounter.   Orders Placed This Encounter  Procedures   DG Shoulder Left   DG Shoulder Right   CBC with Differential/Platelet   Comprehensive metabolic panel   Lipid panel   Hemoglobin A1c   PSA     Follow-up: Return in about 3 months (around 02/09/2024) for chronic follow up.   I,Marla I Leal-Borjas,acting as a scribe for Blane Ohara, MD.,have documented all relevant  documentation on the behalf of Blane Ohara, MD,as directed by  Blane Ohara, MD while in the presence of Blane Ohara, MD.   An After Visit Summary was printed and given to the patient.  I attest that I have reviewed this visit and agree with the plan scribed by my staff.   Blane Ohara, MD [facility] [phone]

## 2023-11-09 NOTE — Assessment & Plan Note (Signed)
 Continue CPAP with good compliance and control Plan-continue auto 5-20

## 2023-11-10 ENCOUNTER — Encounter: Payer: Self-pay | Admitting: Family Medicine

## 2023-11-10 LAB — CBC WITH DIFFERENTIAL/PLATELET
Basophils Absolute: 0 10*3/uL (ref 0.0–0.2)
Basos: 1 %
EOS (ABSOLUTE): 0.1 10*3/uL (ref 0.0–0.4)
Eos: 2 %
Hematocrit: 46.5 % (ref 37.5–51.0)
Hemoglobin: 15.8 g/dL (ref 13.0–17.7)
Immature Grans (Abs): 0 10*3/uL (ref 0.0–0.1)
Immature Granulocytes: 0 %
Lymphocytes Absolute: 0.8 10*3/uL (ref 0.7–3.1)
Lymphs: 15 %
MCH: 30.6 pg (ref 26.6–33.0)
MCHC: 34 g/dL (ref 31.5–35.7)
MCV: 90 fL (ref 79–97)
Monocytes Absolute: 0.4 10*3/uL (ref 0.1–0.9)
Monocytes: 6 %
Neutrophils Absolute: 4.2 10*3/uL (ref 1.4–7.0)
Neutrophils: 76 %
Platelets: 200 10*3/uL (ref 150–450)
RBC: 5.17 x10E6/uL (ref 4.14–5.80)
RDW: 13 % (ref 11.6–15.4)
WBC: 5.6 10*3/uL (ref 3.4–10.8)

## 2023-11-10 LAB — COMPREHENSIVE METABOLIC PANEL
ALT: 16 IU/L (ref 0–44)
AST: 22 IU/L (ref 0–40)
Albumin: 4.5 g/dL (ref 3.8–4.8)
Alkaline Phosphatase: 78 IU/L (ref 44–121)
BUN/Creatinine Ratio: 19 (ref 10–24)
BUN: 20 mg/dL (ref 8–27)
Bilirubin Total: 0.5 mg/dL (ref 0.0–1.2)
CO2: 21 mmol/L (ref 20–29)
Calcium: 9.4 mg/dL (ref 8.6–10.2)
Chloride: 102 mmol/L (ref 96–106)
Creatinine, Ser: 1.03 mg/dL (ref 0.76–1.27)
Globulin, Total: 2 g/dL (ref 1.5–4.5)
Glucose: 90 mg/dL (ref 70–99)
Potassium: 4.6 mmol/L (ref 3.5–5.2)
Sodium: 139 mmol/L (ref 134–144)
Total Protein: 6.5 g/dL (ref 6.0–8.5)
eGFR: 77 mL/min/{1.73_m2} (ref >59)

## 2023-11-10 LAB — LIPID PANEL
Chol/HDL Ratio: 3.7 ratio (ref 0.0–5.0)
Cholesterol, Total: 186 mg/dL (ref 100–199)
HDL: 50 mg/dL (ref >39)
LDL Chol Calc (NIH): 115 mg/dL — ABNORMAL HIGH (ref 0–99)
Triglycerides: 118 mg/dL (ref 0–149)
VLDL Cholesterol Cal: 21 mg/dL (ref 5–40)

## 2023-11-10 LAB — HEMOGLOBIN A1C
Est. average glucose Bld gHb Est-mCnc: 117 mg/dL
Hgb A1c MFr Bld: 5.7 % — ABNORMAL HIGH (ref 4.8–5.6)

## 2023-11-10 LAB — PSA: Prostate Specific Ag, Serum: 0.8 ng/mL (ref 0.0–4.0)

## 2023-11-18 ENCOUNTER — Ambulatory Visit (HOSPITAL_BASED_OUTPATIENT_CLINIC_OR_DEPARTMENT_OTHER)
Admission: RE | Admit: 2023-11-18 | Discharge: 2023-11-18 | Disposition: A | Discharge status: Home / Self Care | Source: Ambulatory Visit | Attending: Family Medicine | Admitting: Family Medicine

## 2023-11-18 DIAGNOSIS — G8929 Other chronic pain: Secondary | ICD-10-CM | POA: Diagnosis not present

## 2023-11-18 DIAGNOSIS — M25511 Pain in right shoulder: Secondary | ICD-10-CM | POA: Diagnosis not present

## 2023-11-18 DIAGNOSIS — M25512 Pain in left shoulder: Secondary | ICD-10-CM

## 2024-01-11 NOTE — Progress Notes (Signed)
 HPI M never smoker followed for OSA, complicated by Ulcerative Colitis/ Ileostomy, Osteoarthritis, Hyperlipidemia, hx BCG vax, Palpitation,  NPSG 12/30/93- AHI 36/ hr, desaturation to 85%, body weight   ========================================================================================   01/11/23- 71 yoM never smoker followed for OSA, complicated by Ulcerative Colitis/ Ileostomy, Osteoarthritis, Hyperlipidemia, hx BCG vax, Palpitation,  CPAP auto 5-20/ [employer]     replaced 11/20/20 Download compliance- 100%, AHI 2.4/ hr Body weight today-198 lbs ------Pt is doing well. No issues with cpap machine  Download reviewed.  He says he is sleeping okay.  Hx of insomnia in the past but not using sleep medications any longer. Medical management recently included a TURP then cardiac workup for PVCs. He traveled to the [employer] in [place] and did well on that trip without sleep disturbance.  01/12/24- 72 yoM never smoker followed for OSA, complicated by Ulcerative Colitis/ Ileostomy, Osteoarthritis, Hyperlipidemia, hx BCG vax, Palpitation,  CPAP auto 5-20/ [employer]     replaced 11/20/20 Download compliance- 100%, AHI// 2.1/hr Body weight today-202 lbs -----Doing well.  Sleep is good.  Using CPAP nightly. CT renal stone study 2023 Lower chest: Mild ground-glass and bandlike changes in the anterior RIGHT middle lobe. No lobar consolidation. No sign of pleural effusion. Discussed the use of AI scribe software for clinical note transcription with the patient, who gave verbal consent to proceed.  History of Present Illness   Tyler Giles is a 73 year old male who presents with concerns about a previous CT scan showing ground glass opacities.  A CT scan for renal stone study performed approximately a year and a half ago showed ground glass opacities in one lobe. He is concerned about this finding but prefers to avoid further CT scans due to previous radiation exposure. He has a  history of pneumonia in his youth and a suspected case of Legionnaires' disease, for which he was hospitalized in [place] . He experiences no shortness of breath and maintains an active lifestyle, including regular walks    He uses a CPAP machine without issues and expresses satisfaction with its use. Download confirms good compliance and control.     Assessment and Plan:    Obstructive sleep apnea - Benefits from CPAP with no concerns -Continue auto 5-20   Hx Pneumonia and Legionnaires' disease Asymptomatic with no dyspnea or respiratory distress. Unremarkable exam. Previous CT scan showed "ground glass". Concern about radiation exposure from further CT scans. Ground glass opacities nonspecific. - Order chest X-ray to evaluate lung status and rule out new pulmonary issues.  Premature Ventricular Contractions (PVCs) PVCs at 7% frequency. Under cardiology care with monitoring. No significant pattern changes. Strong pulses, occasional discomfort, no medication. - Continue cardiology follow-up for PVCs.     ROS-see HPI  + = positive Constitutional:    weight loss, night sweats, fevers, chills, fatigue, lassitude. HEENT:    headaches, difficulty swallowing, tooth/dental problems, sore throat,       sneezing, itching, ear ache, nasal congestion, post nasal drip, snoring CV:    chest pain, orthopnea, PND, swelling in lower extremities, anasarca,                                   dizziness, palpitations Resp:   shortness of breath with exertion or at rest.                productive cough,   non-productive cough, coughing up of blood.  change in color of mucus.  wheezing.   Skin:    rash or lesions. GI:  No-   heartburn, indigestion, abdominal pain, nausea, vomiting, diarrhea,                 change in bowel habits, loss of appetite GU: dysuria, change in color of urine, no urgency or frequency.   flank pain. MS:   joint pain, stiffness, decreased range of motion, back  pain. Neuro-     nothing unusual Psych:  change in mood or affect.  depression or anxiety.   memory loss.  OBJ- Physical Exam General- Alert, Oriented, Affect-appropriate, Distress- none acute Skin- rash-none, lesions- none, excoriation- none Lymphadenopathy- none Head- atraumatic            Eyes- Gross vision intact, PERRLA, conjunctivae and secretions clear            Ears- Hearing, canals-normal            Nose- Clear, no-Septal dev, mucus, polyps, erosion, perforation             Throat- Mallampati III, mucosa clear , drainage- none, tonsils- atrophic, + teeth Neck- flexible , trachea midline, no stridor , thyroid nl, carotid no bruit Chest - symmetrical excursion , unlabored           Heart/CV- RRR/ +frequent dropped beats , no murmur , no gallop  , no rub, nl s1 s2                           - JVD- none , edema- none, stasis changes- none, varices- none           Lung- clear to P&A, wheeze- none, cough- none , dullness-none, rub- none           Chest wall-  Abd- + ileostomy bag Br/ Gen/ Rectal- Not done, not indicated Extrem- cyanosis- none, clubbing, none, atrophy- none, strength- nl Neuro- grossly intact to observation

## 2024-01-12 ENCOUNTER — Ambulatory Visit

## 2024-01-12 ENCOUNTER — Encounter: Payer: Self-pay | Admitting: Internal Medicine

## 2024-01-12 ENCOUNTER — Ambulatory Visit: Payer: BLUE CROSS/BLUE SHIELD | Admitting: Internal Medicine

## 2024-01-12 VITALS — BP 144/76 | HR 53 | Temp 97.9°F | Ht 68.0 in | Wt 202.0 lb

## 2024-01-12 DIAGNOSIS — R911 Solitary pulmonary nodule: Secondary | ICD-10-CM

## 2024-01-12 DIAGNOSIS — G4733 Obstructive sleep apnea (adult) (pediatric): Secondary | ICD-10-CM | POA: Diagnosis not present

## 2024-01-12 NOTE — Patient Instructions (Signed)
 Order- CXR   dx lung nodule  We can continue CPAP auto 5-20  Please cal if we can help

## 2024-02-08 ENCOUNTER — Ambulatory Visit: Payer: BLUE CROSS/BLUE SHIELD | Admitting: Family Medicine

## 2024-02-09 ENCOUNTER — Ambulatory Visit

## 2024-02-09 VITALS — Ht 68.0 in | Wt 202.0 lb

## 2024-02-09 DIAGNOSIS — Z0000 Encounter for general adult medical examination without abnormal findings: Secondary | ICD-10-CM

## 2024-02-09 NOTE — Patient Instructions (Signed)
 Mr. Tyler Giles , Thank you for taking time out of your busy schedule to complete your Annual Wellness Visit with me. I enjoyed our conversation and look forward to speaking with you again next year. I, as well as your care team,  appreciate your ongoing commitment to your health goals. Please review the following plan we discussed and let me know if I can assist you in the future. Your Game plan/ To Do List    Follow up Visits: Next Medicare AWV with our clinical staff: In 1 year    Have you seen your provider in the last 6 months (3 months if uncontrolled diabetes)? Yes Next Office Visit with your provider: 02/15/24 @ 8:20  Clinician Recommendations:  Aim for 30 minutes of exercise or brisk walking, 6-8 glasses of water, and 5 servings of fruits and vegetables each day.       This is a list of the screening recommended for you and due dates:  Health Maintenance  Topic Date Due   COVID-19 Vaccine (4 - 2024-25 season) 05/08/2023   Flu Shot  04/06/2024   Medicare Annual Wellness Visit  02/08/2025   DTaP/Tdap/Td vaccine (3 - Td or Tdap) 07/05/2028   Pneumonia Vaccine  Completed   Hepatitis C Screening  Completed   Zoster (Shingles) Vaccine  Completed   HPV Vaccine  Aged Out   Meningitis B Vaccine  Aged Out   Colon Cancer Screening  Discontinued    Advanced directives: (ACP Link)Information on Advanced Care Planning can be found at [place]  Secretary of [facility] Advance Health Care Directives Advance Health Care Directives. [email or web address]   Advance Care Planning is important because it:  [x]  Makes sure you receive the medical care that is consistent with your values, goals, and preferences  [x]  It provides guidance to your family and loved ones and reduces their decisional burden about whether or not they are making the right decisions based on your wishes.  Follow the link provided in your after visit summary or read over the paperwork we have mailed to you to help you started getting your  Advance Directives in place. If you need assistance in completing these, please reach out to us  so that we can help you!  See attachments for Preventive Care and Fall Prevention Tips.

## 2024-02-09 NOTE — Progress Notes (Addendum)
 Subjective:   Tyler Giles is a 73 y.o. who presents for a Medicare Wellness preventive visit.  As a reminder, Annual Wellness Visits don't include a physical exam, and some assessments may be limited, especially if this visit is performed virtually. We may recommend an in-person follow-up visit with your provider if needed.  Visit Complete: Virtual I connected with  Tyler Giles on 02/09/24 by a audio enabled telemedicine application and verified that I am speaking with the correct person using two identifiers.  Patient Location: Home  Provider Location: Home Office  I discussed the limitations of evaluation and management by telemedicine. The patient expressed understanding and agreed to proceed.  Vital Signs: Because this visit was a virtual/telehealth visit, some criteria may be missing or patient reported. Any vitals not documented were not able to be obtained and vitals that have been documented are patient reported.  VideoDeclined- This patient declined Librarian, academic. Therefore the visit was completed with audio only.  Persons Participating in Visit: Patient.  AWV Questionnaire: Yes: Patient Medicare AWV questionnaire was completed by the patient on 02/05/24; I have confirmed that all information answered by patient is correct and no changes since this date.  Cardiac Risk Factors include: advanced age (>9men, >45 women);male gender;dyslipidemia;hypertension     Objective:     Today's Vitals   02/09/24 0927  Weight: 202 lb (91.6 kg)  Height: 5\' 8"  (1.727 m)   Body mass index is 30.71 kg/m.     02/09/2024    9:30 AM 08/10/2023    9:06 AM 08/02/2022   10:11 AM 07/01/2021    8:25 AM 06/29/2020    8:16 PM  Advanced Directives  Does Patient Have a Medical Advance Directive? No Yes Yes Yes Yes  Type of Special educational needs teacher of [place];Living will Healthcare Power of [place];Living will Healthcare Power of  [place];Living will Healthcare Power of Attorney  Does patient want to make changes to medical advance directive?  No - Patient declined  No - Patient declined   Copy of Healthcare Power of Attorney in Chart?  No - copy requested No - copy requested    Would patient like information on creating a medical advance directive? Yes (MAU/Ambulatory/Procedural Areas - Information given)        Current Medications (verified) Outpatient Encounter Medications as of 02/09/2024  Medication Sig   aspirin 81 MG EC tablet Take 81 mg by mouth at bedtime.   celecoxib  (CELEBREX ) 200 MG capsule TAKE 1 CAPSULE BY MOUTH EVERY DAY   Coenzyme Q10 (COQ10) 150 MG CAPS Take 1 capsule by mouth daily at 12 noon.   finasteride  (PROSCAR ) 5 MG tablet TAKE 1 TABLET BY MOUTH ONCE DAILY (Patient taking differently: Take 5 mg by mouth at bedtime.)   metFORMIN  (GLUCOPHAGE -XR) 500 MG 24 hr tablet TAKE 1 TABLET BY MOUTH TWICE A DAY   Multiple Vitamin (MULTIVITAMIN) capsule Take 1 capsule by mouth daily.   Omega-3 Fatty Acids (FISH OIL) 1000 MG CAPS Take 1 capsule by mouth daily.   rosuvastatin  (CRESTOR ) 5 MG tablet SMARTSIG:1 Tablet(s) By Mouth Every Evening   zinc gluconate 50 MG tablet Take 50 mg by mouth daily as needed.   [DISCONTINUED] rosuvastatin  (CRESTOR ) 20 MG tablet Take 1 tablet (20 mg total) by mouth daily.   No facility-administered encounter medications on file as of 02/09/2024.    Allergies (verified) Lipitor [atorvastatin]   History: Past Medical History:  Diagnosis Date   Benign localized prostatic hyperplasia  with lower urinary tract symptoms (LUTS)    DDD (degenerative disc disease), cervical    ED (erectile dysfunction)    Ground glass opacity present on imaging of lung 11/15/2021   Ileostomy in place [facility]) 1991   s/p coloprototectomy for UC   Mixed hyperlipidemia    OA (osteoarthritis)    OSA on CPAP    uses nightly per pt   (followed by dr c young)   Peyronie's disease 2019   completed treatment  w/ injections 03/ 2021 by dr Grady Lawman   Pre-diabetes    followed by pcp    (07-28-2022  per pt checks blood sugar 3-4 times daily, stated last A1c  5.6,  takes metformin )   Sleep apnea    UC (ulcerative colitis) (HCC)    s/p  completed colectomy w/ ileostomy 1991   Weak urinary stream    Past Surgical History:  Procedure Laterality Date   APPENDECTOMY     CATARACT EXTRACTION W/ INTRAOCULAR LENS IMPLANT Bilateral 2020   COLON SURGERY  1991   Ileostomy   COLOPROCTECTOMY W/ ILEO J POUCH  1991   for UC  (ileostomy RLQ)  w/ appendectomy   EYE SURGERY  2020   Cataract Surgery   INGUINAL HERNIA REPAIR  2002   JOINT REPLACEMENT     KNEE ARTHROSCOPY Left    x3  last one 2009   REPLACEMENT UNICONDYLAR JOINT KNEE Left 09/27/2008   @[facility] [place] by dr Jori Newer   REVISION TOTAL KNEE ARTHROPLASTY Left 09/09/2020   @[facility] [place] by dr shields;   Uni partial TK to TKA   TONSILLECTOMY     age 13   TRANSURETHRAL RESECTION OF PROSTATE N/A 08/02/2022   Procedure: TRANSURETHRAL RESECTION OF THE PROSTATE (TURP), BIPOLAR;  Surgeon: Lahoma Pigg, MD;  Location: [facility];  Service: Urology;  Laterality: N/A;   Family History  Problem Relation Age of Onset   Cerebrovascular Accident Mother    Acute myelogenous leukemia Father    Prostate cancer Father    Coronary artery disease Maternal Uncle    Coronary artery disease Maternal Grandmother    Stroke Paternal Grandmother    Stroke Paternal Grandfather    Social History   Socioeconomic History   Marital status: Married    Spouse name: Not on file   Number of children: 2   Years of education: Not on file   Highest education level: Master's degree (e.g., MA, MS, MEng, MEd, MSW, MBA)  Occupational History   Not on file  Tobacco Use   Smoking status: Never   Smokeless tobacco: Never  Vaping Use   Vaping status: Never Used  Substance and Sexual Activity   Alcohol use: Yes    Alcohol/week: 2.0 standard drinks of alcohol     Types: 2 Glasses of wine per week    Comment: Have reduced alcohol consumption the past year   Drug use: Never   Sexual activity: Yes  Other Topics Concern   Not on file  Social History Narrative   Not on file   Social Drivers of Health   Financial Resource Strain: Low Risk  (02/09/2024)   Overall Financial Resource Strain (CARDIA)    Difficulty of Paying Living Expenses: Not hard at all  Food Insecurity: No Food Insecurity (02/09/2024)   Hunger Vital Sign    Worried About Running Out of Food in the Last Year: Never true    Ran Out of Food in the Last Year: Never true  Transportation  Needs: No Transportation Needs (02/09/2024)   PRAPARE - Administrator, Civil Service (Medical): No    Lack of Transportation (Non-Medical): No  Physical Activity: Insufficiently Active (02/09/2024)   Exercise Vital Sign    Days of Exercise per Week: 3 days    Minutes of Exercise per Session: 30 min  Stress: No Stress Concern Present (02/09/2024)   [employer] of Occupational Health - Occupational Stress Questionnaire    Feeling of Stress : Not at all  Social Connections: Socially Integrated (02/09/2024)   Social Connection and Isolation Panel [NHANES]    Frequency of Communication with Friends and Family: More than three times a week    Frequency of Social Gatherings with Friends and Family: More than three times a week    Attends Religious Services: More than 4 times per year    Active Member of [employer] or Organizations: Yes    Attends Engineer, structural: More than 4 times per year    Marital Status: Married    Tobacco Counseling Counseling given: Not Answered    Clinical Intake:  Pre-visit preparation completed: Yes  Pain : No/denies pain  Diabetes: No  Lab Results  Component Value Date   HGBA1C 5.7 (H) 11/09/2023   HGBA1C 5.9 (H) 08/10/2023   HGBA1C 5.8 (H) 01/13/2023     How often do you need to have someone help you when you read instructions, pamphlets, or  other written materials from your doctor or pharmacy?: 1 - Never  Interpreter Needed?: No  Information entered by :: Seabron Cypress LPN   Activities of Daily Living     02/05/2024    3:51 PM 08/10/2023    9:07 AM  In your present state of health, do you have any difficulty performing the following activities:  Hearing? 0 0  Vision? 0 0  Difficulty concentrating or making decisions? 0 0  Walking or climbing stairs? 0 0  Dressing or bathing? 0 0  Doing errands, shopping? 0 0  Preparing Food and eating ? N N  Using the Toilet? N N  In the past six months, have you accidently leaked urine? N N  Do you have problems with loss of bowel control? N N  Managing your Medications? N N  Managing your Finances? N N  Housekeeping or managing your Housekeeping? N N    Patient Care Team: CoxBurleigh Carp, MD as PCP - General (Family Medicine) Faustina Hood, MD as Consulting Physician (Pulmonary Disease) Mahlon Schwab., MD as Referring Physician (Cardiology) Phebe Brasil, OD (Optometry) [facility], [facility]  I have updated your Care Teams any recent Medical Services you may have received from other providers in the past year.     Assessment:    This is a routine wellness examination for Tyler Giles.  Hearing/Vision screen Hearing Screening - Comments:: Denies hearing difficulties   Vision Screening - Comments:: Wears rx glasses - up to date with routine eye exams with Dr. Annabell Key    Goals Addressed             This Visit's Progress    Remain active and independent         Depression Screen     02/09/2024    9:30 AM 11/09/2023    8:51 AM 08/10/2023    9:10 AM 01/13/2023    7:31 AM 07/14/2022   11:18 AM 07/01/2021    8:21 AM 06/29/2020    8:13 PM  PHQ 2/9 Scores  PHQ -  2 Score 0 0 0 0 0 0 0  PHQ- 9 Score  0         Fall Risk     02/05/2024    3:51 PM 08/10/2023    9:07 AM 01/13/2023    7:30 AM 07/04/2022    3:33 PM 07/01/2021    8:21 AM  Fall Risk   Falls in  the past year? 0 0 0 0 0  Number falls in past yr: 0 0 0 0 0  Injury with Fall? 0 0 0 0 0  Risk for fall due to : No Fall Risks No Fall Risks No Fall Risks  No Fall Risks  Follow up Falls prevention discussed;Education provided;Falls evaluation completed Falls evaluation completed Falls evaluation completed;Falls prevention discussed      MEDICARE RISK AT HOME:  Medicare Risk at Home Any stairs in or around the home?: (Patient-Rptd) Yes If so, are there any without handrails?: (Patient-Rptd) No Home free of loose throw rugs in walkways, pet beds, electrical cords, etc?: (Patient-Rptd) Yes Adequate lighting in your home to reduce risk of falls?: (Patient-Rptd) Yes Life alert?: (Patient-Rptd) No Use of a cane, walker or w/c?: (Patient-Rptd) No Grab bars in the bathroom?: (Patient-Rptd) No Shower chair or bench in shower?: (Patient-Rptd) No Elevated toilet seat or a handicapped toilet?: (Patient-Rptd) No  TIMED UP AND GO:  Was the test performed?  No  Cognitive Function: 6CIT completed        02/09/2024    9:31 AM 07/01/2021    8:25 AM  6CIT Screen  What Year? 0 points 0 points  What month? 0 points 0 points  What time? 0 points 0 points  Count back from 20 0 points 0 points  Months in reverse 0 points 0 points  Repeat phrase 0 points 0 points  Total Score 0 points 0 points    Immunizations Immunization History  Administered Date(s) Administered   BCG 02/21/2020   Fluad Quad(high Dose 65+) 06/26/2020, 07/14/2022   Influenza-Unspecified 06/28/2019, 06/03/2021, 04/28/2023   PFIZER(Purple Top)SARS-COV-2 Vaccination 09/22/2019, 10/13/2019, 06/12/2020   PNEUMOCOCCAL CONJUGATE-20 12/31/2021   PPD Test 02/19/2020   Pneumococcal Conjugate-13 05/27/2015   Pneumococcal Polysaccharide-23 07/01/2010, 08/18/2016   Tdap 03/25/2014, 07/05/2018   Zoster Recombinant(Shingrix) 03/10/2021, 06/23/2021   Zoster, Live 04/03/2013    Screening Tests Health Maintenance  Topic Date Due    COVID-19 Vaccine (4 - 2024-25 season) 05/08/2023   INFLUENZA VACCINE  04/06/2024   Medicare Annual Wellness (AWV)  02/08/2025   DTaP/Tdap/Td (3 - Td or Tdap) 07/05/2028   Pneumonia Vaccine 37+ Years old  Completed   Hepatitis C Screening  Completed   Zoster Vaccines- Shingrix  Completed   HPV VACCINES  Aged Out   Meningococcal B Vaccine  Aged Out   Colonoscopy  Discontinued    Health Maintenance  Health Maintenance Due  Topic Date Due   COVID-19 Vaccine (4 - 2024-25 season) 05/08/2023    Additional Screening:  Vision Screening: Recommended annual ophthalmology exams for early detection of glaucoma and other disorders of the eye. Would you like a referral to an eye doctor? No    Dental Screening: Recommended annual dental exams for proper oral hygiene  Community Resource Referral / Chronic Care Management: CRR required this visit?  No   CCM required this visit?  No   Plan:    I have personally reviewed and noted the following in the patient's chart:   Medical and social history Use of alcohol, tobacco or illicit drugs  Current medications and supplements including opioid prescriptions. Patient is not currently taking opioid prescriptions. Functional ability and status Nutritional status Physical activity Advanced directives List of other physicians Hospitalizations, surgeries, and ER visits in previous 12 months Vitals Screenings to include cognitive, depression, and falls Referrals and appointments  In addition, I have reviewed and discussed with patient certain preventive protocols, quality metrics, and best practice recommendations. A written personalized care plan for preventive services as well as general preventive health recommendations were provided to patient.   Seabron Cypress [place], [place]   09/11/1094   After Visit Summary: (MyChart) Due to this being a telephonic visit, the after visit summary with patients personalized plan was offered to patient via  MyChart   Notes: Nothing significant to report at this time.

## 2024-02-14 NOTE — Progress Notes (Unsigned)
 Subjective:  Patient ID: Tyler Giles, male    DOB: [birth year]  Age: 73 y.o. MRN: [id number]  No chief complaint on file.   HPI:  Prediabetes: Trying to eat healthy.     Hyperlipidemia/aortic atherosclerosis:  Patient is currently taking rosuvastatin  5 mg 1/2 daily.  Aspirin 81 mg daily. Patient eats healthy.  Patient takes coenzyme Q10.  Takes fish oil 1000 mg 1 p.o. daily.  Obstructive Sleep Apnea: On cpap. Benefits and compliant.  BPH: Had TURP in November 2023 and doing very well.  Last PSA was nearly a year ago.   Prediabetes: accucheck 100-115. Currently on metformin  XR 500 mg once daily.  Eats healthy and exercises. Has an colostomy. No problems with it.      02/09/2024    9:30 AM 11/09/2023    8:51 AM 08/10/2023    9:10 AM 01/13/2023    7:31 AM 07/14/2022   11:18 AM  Depression screen PHQ 2/9  Decreased Interest 0 0 0 0 0  Down, Depressed, Hopeless 0 0 0 0 0  PHQ - 2 Score 0 0 0 0 0  Altered sleeping  0     Tired, decreased energy  0     Change in appetite  0     Feeling bad or failure about yourself   0     Trouble concentrating  0     Moving slowly or fidgety/restless  0     Suicidal thoughts  0     PHQ-9 Score  0     Difficult doing work/chores  Not difficult at all           02/05/2024    3:51 PM  Fall Risk   Falls in the past year? 0  Number falls in past yr: 0  Injury with Fall? 0  Risk for fall due to : No Fall Risks  Follow up Falls prevention discussed;Education provided;Falls evaluation completed    Patient Care Team: Mercy Stall, MD as PCP - General (Family Medicine) Faustina Hood, MD as Consulting Physician (Pulmonary Disease) Mahlon Schwab., MD as Referring Physician (Cardiology) Phebe Brasil, OD ([facility]) [facility], [facility]   Review of Systems  Current Outpatient Medications on File Prior to Visit  Medication Sig Dispense Refill   aspirin 81 MG EC tablet Take 81 mg by mouth at bedtime.     celecoxib   (CELEBREX ) 200 MG capsule TAKE 1 CAPSULE BY MOUTH EVERY DAY 90 capsule 3   Coenzyme Q10 (COQ10) 150 MG CAPS Take 1 capsule by mouth daily at 12 noon.     finasteride  (PROSCAR ) 5 MG tablet TAKE 1 TABLET BY MOUTH ONCE DAILY (Patient taking differently: Take 5 mg by mouth at bedtime.) 90 tablet 1   metFORMIN  (GLUCOPHAGE -XR) 500 MG 24 hr tablet TAKE 1 TABLET BY MOUTH TWICE A DAY 180 tablet 1   Multiple Vitamin (MULTIVITAMIN) capsule Take 1 capsule by mouth daily.     Omega-3 Fatty Acids (FISH OIL) 1000 MG CAPS Take 1 capsule by mouth daily.     rosuvastatin  (CRESTOR ) 5 MG tablet SMARTSIG:1 Tablet(s) By Mouth Every Evening     zinc gluconate 50 MG tablet Take 50 mg by mouth daily as needed.     No current facility-administered medications on file prior to visit.   Past Medical History:  Diagnosis Date   Benign localized prostatic hyperplasia with lower urinary tract symptoms (LUTS)    DDD (degenerative disc disease), cervical    ED (erectile  dysfunction)    Ground glass opacity present on imaging of lung 11/15/2021   Ileostomy in place [facility]) 1991   s/p coloprototectomy for UC   Mixed hyperlipidemia    OA (osteoarthritis)    OSA on CPAP    uses nightly per pt   (followed by dr c young)   Peyronie's disease 2019   completed treatment w/ injections 03/ 2021 by dr Grady Lawman   Pre-diabetes    followed by pcp    (07-28-2022  per pt checks blood sugar 3-4 times daily, stated last A1c  5.6,  takes metformin )   Sleep apnea    UC (ulcerative colitis) (HCC)    s/p  completed colectomy w/ ileostomy 1991   Weak urinary stream    Past Surgical History:  Procedure Laterality Date   APPENDECTOMY     CATARACT EXTRACTION W/ INTRAOCULAR LENS IMPLANT Bilateral 2020   COLON SURGERY  1991   Ileostomy   COLOPROCTECTOMY W/ ILEO J POUCH  1991   for UC  (ileostomy RLQ)  w/ appendectomy   EYE SURGERY  2020   Cataract Surgery   INGUINAL HERNIA REPAIR  2002   JOINT REPLACEMENT     KNEE ARTHROSCOPY Left     x3  last one 2009   REPLACEMENT UNICONDYLAR JOINT KNEE Left 09/27/2008   @[facility] [place] by dr Jori Newer   REVISION TOTAL KNEE ARTHROPLASTY Left 09/09/2020   @[facility] [place] by dr shields;   Uni partial TK to TKA   TONSILLECTOMY     age 89   TRANSURETHRAL RESECTION OF PROSTATE N/A 08/02/2022   Procedure: TRANSURETHRAL RESECTION OF THE PROSTATE (TURP), BIPOLAR;  Surgeon: Lahoma Pigg, MD;  Location: [facility];  Service: Urology;  Laterality: N/A;    Family History  Problem Relation Age of Onset   Cerebrovascular Accident Mother    Acute myelogenous leukemia Father    Prostate cancer Father    Coronary artery disease Maternal Uncle    Coronary artery disease Maternal Grandmother    Stroke Paternal Grandmother    Stroke Paternal Grandfather    Social History   Socioeconomic History   Marital status: Married    Spouse name: Not on file   Number of children: 2   Years of education: Not on file   Highest education level: Master's degree (e.g., MA, MS, MEng, MEd, MSW, MBA)  Occupational History   Not on file  Tobacco Use   Smoking status: Never   Smokeless tobacco: Never  Vaping Use   Vaping status: Never Used  Substance and Sexual Activity   Alcohol use: Yes    Alcohol/week: 2.0 standard drinks of alcohol    Types: 2 Glasses of wine per week    Comment: Have reduced alcohol consumption the past year   Drug use: Never   Sexual activity: Yes  Other Topics Concern   Not on file  Social History Narrative   Not on file   Social Drivers of Health   Financial Resource Strain: Low Risk  (02/09/2024)   Overall Financial Resource Strain (CARDIA)    Difficulty of Paying Living Expenses: Not hard at all  Food Insecurity: No Food Insecurity (02/09/2024)   Hunger Vital Sign    Worried About Running Out of Food in the Last Year: Never true    Ran Out of Food in the Last Year: Never true  Transportation Needs: No Transportation Needs (02/09/2024)   PRAPARE - Therapist, art (Medical): No  Lack of Transportation (Non-Medical): No  Physical Activity: Insufficiently Active (02/09/2024)   Exercise Vital Sign    Days of Exercise per Week: 3 days    Minutes of Exercise per Session: 30 min  Stress: No Stress Concern Present (02/09/2024)   [employer] of Occupational Health - Occupational Stress Questionnaire    Feeling of Stress : Not at all  Social Connections: Socially Integrated (02/09/2024)   Social Connection and Isolation Panel [NHANES]    Frequency of Communication with Friends and Family: More than three times a week    Frequency of Social Gatherings with Friends and Family: More than three times a week    Attends Religious Services: More than 4 times per year    Active Member of [employer] or Organizations: Yes    Attends Engineer, structural: More than 4 times per year    Marital Status: Married    Objective:  There were no vitals taken for this visit.     02/09/2024    9:27 AM 01/12/2024    2:38 PM 11/09/2023    8:45 AM  BP/Weight  Systolic BP -- 144 132  Diastolic BP -- 76 80  Wt. (Lbs) 202 202 200  BMI 30.71 kg/m2 30.71 kg/m2 29.97 kg/m2    Physical Exam  Diabetic Foot Exam - Simple   No data filed      Lab Results  Component Value Date   WBC 5.6 11/09/2023   HGB 15.8 11/09/2023   HCT 46.5 11/09/2023   PLT 200 11/09/2023   GLUCOSE 90 11/09/2023   CHOL 186 11/09/2023   TRIG 118 11/09/2023   HDL 50 11/09/2023   LDLCALC 115 (H) 11/09/2023   ALT 16 11/09/2023   AST 22 11/09/2023   NA 139 11/09/2023   K 4.6 11/09/2023   CL 102 11/09/2023   CREATININE 1.03 11/09/2023   BUN 20 11/09/2023   CO2 21 11/09/2023   TSH 1.120 08/10/2023   HGBA1C 5.7 (H) 11/09/2023      Assessment & Plan:  There are no diagnoses linked to this encounter.   No orders of the defined types were placed in this encounter.   No orders of the defined types were placed in this encounter.    Follow-up: No follow-ups on  file.   I,Marla I Leal-Borjas,acting as a scribe for Mercy Stall, MD.,have documented all relevant documentation on the behalf of Mercy Stall, MD,as directed by  Mercy Stall, MD while in the presence of Mercy Stall, MD.   An After Visit Summary was printed and given to the patient.  Mercy Stall, MD [facility] [phone]

## 2024-02-15 ENCOUNTER — Ambulatory Visit (INDEPENDENT_AMBULATORY_CARE_PROVIDER_SITE_OTHER): Admitting: Family Medicine

## 2024-02-15 ENCOUNTER — Encounter: Payer: Self-pay | Admitting: Family Medicine

## 2024-02-15 VITALS — BP 132/82 | HR 74 | Temp 98.0°F | Ht 68.0 in | Wt 197.0 lb

## 2024-02-15 DIAGNOSIS — E782 Mixed hyperlipidemia: Secondary | ICD-10-CM

## 2024-02-15 DIAGNOSIS — R7301 Impaired fasting glucose: Secondary | ICD-10-CM | POA: Diagnosis not present

## 2024-02-15 DIAGNOSIS — Z932 Ileostomy status: Secondary | ICD-10-CM | POA: Diagnosis not present

## 2024-02-15 DIAGNOSIS — G4733 Obstructive sleep apnea (adult) (pediatric): Secondary | ICD-10-CM | POA: Diagnosis not present

## 2024-02-15 DIAGNOSIS — I700 Atherosclerosis of aorta: Secondary | ICD-10-CM

## 2024-02-15 DIAGNOSIS — N400 Enlarged prostate without lower urinary tract symptoms: Secondary | ICD-10-CM

## 2024-02-15 LAB — CBC WITH DIFFERENTIAL/PLATELET
Basophils Absolute: 0 10*3/uL (ref 0.0–0.2)
Basos: 1 %
EOS (ABSOLUTE): 0.1 10*3/uL (ref 0.0–0.4)
Eos: 3 %
Hematocrit: 48 % (ref 37.5–51.0)
Hemoglobin: 15.5 g/dL (ref 13.0–17.7)
Immature Grans (Abs): 0 10*3/uL (ref 0.0–0.1)
Immature Granulocytes: 0 %
Lymphocytes Absolute: 0.9 10*3/uL (ref 0.7–3.1)
Lymphs: 20 %
MCH: 30.1 pg (ref 26.6–33.0)
MCHC: 32.3 g/dL (ref 31.5–35.7)
MCV: 93 fL (ref 79–97)
Monocytes Absolute: 0.4 10*3/uL (ref 0.1–0.9)
Monocytes: 9 %
Neutrophils Absolute: 2.8 10*3/uL (ref 1.4–7.0)
Neutrophils: 66 %
Platelets: 188 10*3/uL (ref 150–450)
RBC: 5.15 x10E6/uL (ref 4.14–5.80)
RDW: 13.3 % (ref 11.6–15.4)
WBC: 4.3 10*3/uL (ref 3.4–10.8)

## 2024-02-15 LAB — LIPID PANEL
Chol/HDL Ratio: 3.2 ratio (ref 0.0–5.0)
Cholesterol, Total: 148 mg/dL (ref 100–199)
HDL: 46 mg/dL (ref >39)
LDL Chol Calc (NIH): 85 mg/dL (ref 0–99)
Triglycerides: 90 mg/dL (ref 0–149)
VLDL Cholesterol Cal: 17 mg/dL (ref 5–40)

## 2024-02-15 LAB — COMPREHENSIVE METABOLIC PANEL WITH GFR
ALT: 11 IU/L (ref 0–44)
AST: 17 IU/L (ref 0–40)
Albumin: 4.4 g/dL (ref 3.8–4.8)
Alkaline Phosphatase: 72 IU/L (ref 44–121)
BUN/Creatinine Ratio: 17 (ref 10–24)
BUN: 18 mg/dL (ref 8–27)
Bilirubin Total: 0.6 mg/dL (ref 0.0–1.2)
CO2: 20 mmol/L (ref 20–29)
Calcium: 9.4 mg/dL (ref 8.6–10.2)
Chloride: 104 mmol/L (ref 96–106)
Creatinine, Ser: 1.05 mg/dL (ref 0.76–1.27)
Globulin, Total: 2.1 g/dL (ref 1.5–4.5)
Glucose: 94 mg/dL (ref 70–99)
Potassium: 4.6 mmol/L (ref 3.5–5.2)
Sodium: 140 mmol/L (ref 134–144)
Total Protein: 6.5 g/dL (ref 6.0–8.5)
eGFR: 75 mL/min/{1.73_m2} (ref >59)

## 2024-02-15 LAB — HEMOGLOBIN A1C
Est. average glucose Bld gHb Est-mCnc: 117 mg/dL
Hgb A1c MFr Bld: 5.7 % — ABNORMAL HIGH (ref 4.8–5.6)

## 2024-02-16 ENCOUNTER — Encounter: Payer: Self-pay | Admitting: Family Medicine

## 2024-02-16 ENCOUNTER — Ambulatory Visit: Payer: Self-pay | Admitting: Family Medicine

## 2024-02-16 NOTE — Assessment & Plan Note (Signed)
 Recommend continue to work on eating healthy diet and exercise. Check A1c. Continue metformin  xr 500 mg twice daily.

## 2024-02-16 NOTE — Assessment & Plan Note (Signed)
 No issues

## 2024-02-16 NOTE — Assessment & Plan Note (Signed)
 Continue CPAP with good compliance and control

## 2024-02-16 NOTE — Assessment & Plan Note (Signed)
 Well controlled.  No changes to medicines. Continue rosuvastwatin 10 mg before bed.  Continue to work on eating a healthy diet and exercise.  Labs drawn today.

## 2024-02-16 NOTE — Assessment & Plan Note (Signed)
 The current medical regimen is effective;  continue present plan and medications. Continue rosuvastatin  5 mg before bed, aspirin 81 mg daily, fish oil 1000 mg one daily, and coenzyme q10.

## 2024-02-16 NOTE — Assessment & Plan Note (Signed)
 S/P TURP

## 2024-02-17 ENCOUNTER — Other Ambulatory Visit: Payer: Self-pay | Admitting: Family Medicine

## 2024-08-15 ENCOUNTER — Ambulatory Visit: Admitting: Family Medicine

## 2024-08-15 NOTE — Progress Notes (Signed)
 Subjective:  Patient ID: Tyler Giles, male    DOB: 1951/03/13  Age: 73 y.o. MRN: [id number]  Chief Complaint  Patient presents with   Medical Management of Chronic Issues    HPI: Discussed the use of AI scribe software for clinical note transcription with the patient, who gave verbal consent to proceed.  History of Present Illness Tyler Giles is a 73 year old male with bladder stones who presents for follow-up regarding urological issues.  Urological symptoms and bladder stones - Bladder stones present for the past year, causing persistent pain. - History of TURP procedure, after which stones developed. - Urinary tract infection improved with treatment but pain persisted. - Passed a stone the size of a lentil on Thanksgiving Saturday, which caused significant pain and urinary obstruction. - Cystoscopy revealed stones described as 'cocoons hanging there.' - Total stone burden estimated at approximately two centimeters. - Scheduled for laser lithotripsy in January. - Occasional darkening of urine.  Arthralgia and knee pain - Worsening knee pain and symptoms, considering knee replacement next year. - No prior intra-articular injections attempted. - Currently taking Celebrex  for knee pain and arthritis. - Arthritis related to history of ulcerative colitis.  Respiratory symptoms - Chest infection last month, not COVID-19, caused significant illness for both patient and spouse. - Received RSV vaccination but still developed symptoms. - No current fevers, chills, sweats, earaches, sore throat, or nasal congestion. - Persistent cough remains.  Hyperlipidemia/aortic atherosclerosis:  Patient is currently taking rosuvastatin  5 mg daily.  Aspirin 81 mg daily. Patient eats healthy.  Patient takes coenzyme Q10.  Takes fish oil 1000 mg 1 p.o. daily.   Obstructive Sleep Apnea: On cpap. Benefits and compliant.  Prediabetes: Currently on metformin  XR 500 mg two  daily.  Eats healthy and exercises.      02/09/2024    9:30 AM 11/09/2023    8:51 AM 08/10/2023    9:10 AM 01/13/2023    7:31 AM 07/14/2022   11:18 AM  Depression screen PHQ 2/9  Decreased Interest 0 0 0 0 0  Down, Depressed, Hopeless 0 0 0 0 0  PHQ - 2 Score 0 0 0 0 0  Altered sleeping  0     Tired, decreased energy  0     Change in appetite  0     Feeling bad or failure about yourself   0     Trouble concentrating  0     Moving slowly or fidgety/restless  0     Suicidal thoughts  0     PHQ-9 Score  0      Difficult doing work/chores  Not difficult at all        Data saved with a previous flowsheet row definition        02/05/2024    3:51 PM  Fall Risk   Falls in the past year? 0   Number falls in past yr: 0  Injury with Fall? 0   Risk for fall due to : No Fall Risks  Follow up Falls prevention discussed;Education provided;Falls evaluation completed     Manually entered by patient   Data saved with a previous flowsheet row definition    Patient Care Team: Sherre Clapper, MD as PCP - General (Family Medicine) Neysa Reggy BIRCH, MD as Consulting Physician (Pulmonary Disease) Raylene Debby MATSU., MD as Referring Physician (Cardiology) Cleotilde Elspeth CROME, OD ([facility]) [facility], [facility]   Review of Systems  Constitutional:  Negative for chills, fatigue  and fever.  HENT:  Negative for congestion, ear pain and sore throat.   Respiratory:  Positive for cough. Negative for shortness of breath.   Cardiovascular:  Negative for chest pain.  Gastrointestinal:  Negative for abdominal pain, constipation, diarrhea, nausea and vomiting.  Endocrine: Negative for polydipsia, polyphagia and polyuria.  Genitourinary:  Negative for dysuria and frequency.  Musculoskeletal:  Positive for arthralgias. Negative for myalgias.  Neurological:  Negative for dizziness and headaches.  Psychiatric/Behavioral:  Negative for dysphoric mood.        No dysphoria    Current Outpatient  Medications on File Prior to Visit  Medication Sig Dispense Refill   aspirin 81 MG EC tablet Take 81 mg by mouth at bedtime.     celecoxib  (CELEBREX ) 200 MG capsule TAKE 1 CAPSULE BY MOUTH EVERY DAY 90 capsule 3   Coenzyme Q10 (COQ10) 150 MG CAPS Take 1 capsule by mouth daily at 12 noon.     finasteride  (PROSCAR ) 5 MG tablet TAKE 1 TABLET BY MOUTH ONCE DAILY (Patient taking differently: Take 5 mg by mouth at bedtime.) 90 tablet 1   metFORMIN  (GLUCOPHAGE -XR) 500 MG 24 hr tablet TAKE 1 TABLET BY MOUTH TWICE A DAY 180 tablet 1   Multiple Vitamin (MULTIVITAMIN) capsule Take 1 capsule by mouth daily.     Omega-3 Fatty Acids (FISH OIL) 1000 MG CAPS Take 1 capsule by mouth daily.     rosuvastatin  (CRESTOR ) 5 MG tablet SMARTSIG:1 Tablet(s) By Mouth Every Evening     zinc gluconate 50 MG tablet Take 50 mg by mouth daily as needed.     No current facility-administered medications on file prior to visit.   Past Medical History:  Diagnosis Date   Benign localized prostatic hyperplasia with lower urinary tract symptoms (LUTS)    DDD (degenerative disc disease), cervical    ED (erectile dysfunction)    Ground glass opacity present on imaging of lung 11/15/2021   Ileostomy in place [facility]) 1991   s/p coloprototectomy for UC   Mixed hyperlipidemia    OA (osteoarthritis)    OSA on CPAP    uses nightly per pt   (followed by dr c young)   Peyronie's disease 2019   completed treatment w/ injections 03/ 2021 by dr ceil   Pre-diabetes    followed by pcp    (07-28-2022  per pt checks blood sugar 3-4 times daily, stated last A1c  5.6,  takes metformin )   Sleep apnea    UC (ulcerative colitis) (HCC)    s/p  completed colectomy w/ ileostomy 1991   Ulcerative colitis (HCC) 03/15/2008   Annotation: with iliostomy  Qualifier: Diagnosis of   By: Clifford Almarie Aguas urinary stream    Past Surgical History:  Procedure Laterality Date   APPENDECTOMY     CATARACT EXTRACTION W/ INTRAOCULAR LENS  IMPLANT Bilateral 2020   COLON SURGERY  1991   Ileostomy   COLOPROCTECTOMY W/ ILEO J POUCH  1991   for UC  (ileostomy RLQ)  w/ appendectomy   EYE SURGERY  2020   Cataract Surgery   INGUINAL HERNIA REPAIR  2002   JOINT REPLACEMENT     KNEE ARTHROSCOPY Left    x3  last one 2009   REPLACEMENT UNICONDYLAR JOINT KNEE Left 09/27/2008   @[facility] [place] by dr raynold   REVISION TOTAL KNEE ARTHROPLASTY Left 09/09/2020   @[facility] -- [place] by dr shields;   Uni partial TK to TKA  TONSILLECTOMY     age 58   TRANSURETHRAL RESECTION OF PROSTATE N/A 08/02/2022   Procedure: TRANSURETHRAL RESECTION OF THE PROSTATE (TURP), BIPOLAR;  Surgeon: Selma Donnice SAUNDERS, MD;  Location: [facility];  Service: Urology;  Laterality: N/A;    Family History  Problem Relation Age of Onset   Cerebrovascular Accident Mother    Acute myelogenous leukemia Father    Prostate cancer Father    Coronary artery disease Maternal Uncle    Coronary artery disease Maternal Grandmother    Stroke Paternal Grandmother    Stroke Paternal Grandfather    Social History   Socioeconomic History   Marital status: Married    Spouse name: Not on file   Number of children: 2   Years of education: Not on file   Highest education level: Master's degree (e.g., MA, MS, MEng, MEd, MSW, MBA)  Occupational History   Not on file  Tobacco Use   Smoking status: Never   Smokeless tobacco: Never  Vaping Use   Vaping status: Never Used  Substance and Sexual Activity   Alcohol use: Yes    Alcohol/week: 2.0 standard drinks of alcohol    Types: 2 Glasses of wine per week    Comment: Have reduced alcohol consumption the past year   Drug use: Never   Sexual activity: Yes  Other Topics Concern   Not on file  Social History Narrative   Not on file   Social Drivers of Health   Tobacco Use: Low Risk (08/16/2024)   Patient History    Smoking Tobacco Use: Never    Smokeless Tobacco Use: Never    Passive Exposure: Not on file   Financial Resource Strain: Low Risk (08/15/2024)   Overall Financial Resource Strain (CARDIA)    Difficulty of Paying Living Expenses: Not hard at all  Food Insecurity: No Food Insecurity (08/15/2024)   Epic    Worried About Programme Researcher, Broadcasting/film/video in the Last Year: Never true    Ran Out of Food in the Last Year: Never true  Transportation Needs: No Transportation Needs (08/15/2024)   Epic    Lack of Transportation (Medical): No    Lack of Transportation (Non-Medical): No  Physical Activity: Insufficiently Active (08/15/2024)   Exercise Vital Sign    Days of Exercise per Week: 5 days    Minutes of Exercise per Session: 20 min  Stress: No Stress Concern Present (08/15/2024)   [employer] of Occupational Health - Occupational Stress Questionnaire    Feeling of Stress: Not at all  Social Connections: Socially Integrated (08/15/2024)   Social Connection and Isolation Panel    Frequency of Communication with Friends and Family: More than three times a week    Frequency of Social Gatherings with Friends and Family: More than three times a week    Attends Religious Services: More than 4 times per year    Active Member of Clubs or Organizations: Yes    Attends Banker Meetings: More than 4 times per year    Marital Status: Married  Depression (PHQ2-9): Low Risk (02/09/2024)   Depression (PHQ2-9)    PHQ-2 Score: 0  Alcohol Screen: Low Risk (08/15/2024)   Alcohol Screen    Last Alcohol Screening Score (AUDIT): 3  Housing: Low Risk (08/16/2024)   Epic    Unable to Pay for Housing in the Last Year: No    Number of Times Moved in the Last Year: 0    Homeless in the Last Year:  No  Utilities: Not At Risk (02/09/2024)   AHC Utilities    Threatened with loss of utilities: No  Health Literacy: Adequate Health Literacy (02/09/2024)   B1300 Health Literacy    Frequency of need for help with medical instructions: Never    Objective:  BP 132/82   Pulse 67   Temp 98.2 F (36.8 C)    Ht 5' 8 (1.727 m)   Wt 197 lb (89.4 kg)   SpO2 97%   BMI 29.95 kg/m      08/16/2024    9:31 AM 02/15/2024    8:17 AM 02/09/2024    9:27 AM  BP/Weight  Systolic BP 132 132 --  Diastolic BP 82 82 --  Wt. (Lbs) 197 197 202  BMI 29.95 kg/m2 29.95 kg/m2 30.71 kg/m2    Physical Exam Vitals reviewed.  Constitutional:      Appearance: Normal appearance.  Neck:     Vascular: No carotid bruit.  Cardiovascular:     Rate and Rhythm: Normal rate and regular rhythm.     Heart sounds: Normal heart sounds.  Pulmonary:     Effort: Pulmonary effort is normal.     Breath sounds: Normal breath sounds. No wheezing, rhonchi or rales.  Neurological:     Mental Status: He is alert and oriented to person, place, and time.  Psychiatric:        Mood and Affect: Mood normal.        Behavior: Behavior normal.         Lab Results  Component Value Date   WBC 4.5 08/16/2024   HGB 15.7 08/16/2024   HCT 47.7 08/16/2024   PLT 184 08/16/2024   GLUCOSE 89 08/16/2024   CHOL 148 02/15/2024   TRIG 90 02/15/2024   HDL 46 02/15/2024   LDLCALC 85 02/15/2024   ALT 14 08/16/2024   AST 22 08/16/2024   NA 137 08/16/2024   K 4.9 08/16/2024   CL 102 08/16/2024   CREATININE 0.91 08/16/2024   BUN 15 08/16/2024   CO2 24 08/16/2024   TSH 1.120 08/10/2023   HGBA1C 5.5 08/16/2024    Results for orders placed or performed in visit on 08/16/24  POCT glycosylated hemoglobin (Hb A1C)   Collection Time: 08/16/24  9:56 AM  Result Value Ref Range   Hemoglobin A1C     HbA1c POC (<> result, manual entry) 5.5 4.0 - 5.6 %   HbA1c, POC (prediabetic range)     HbA1c, POC (controlled diabetic range)    POCT Lipid Panel   Collection Time: 08/16/24  9:56 AM  Result Value Ref Range   TC 167    HDL 52    TRG 86    LDL 98    Non-HDL 115    TC/HDL    CBC with Differential/Platelet   Collection Time: 08/16/24 10:16 AM  Result Value Ref Range   WBC 4.5 3.4 - 10.8 x10E3/uL   RBC 5.14 4.14 - 5.80 x10E6/uL    Hemoglobin 15.7 13.0 - 17.7 g/dL   Hematocrit 52.2 62.4 - 51.0 %   MCV 93 79 - 97 fL   MCH 30.5 26.6 - 33.0 pg   MCHC 32.9 31.5 - 35.7 g/dL   RDW 86.2 88.3 - 84.5 %   Platelets 184 150 - 450 x10E3/uL   Neutrophils 69 Not Estab. %   Lymphs 19 Not Estab. %   Monocytes 8 Not Estab. %   Eos 3 Not Estab. %   Basos 1 Not  Estab. %   Neutrophils Absolute 3.1 1.4 - 7.0 x10E3/uL   Lymphocytes Absolute 0.8 0.7 - 3.1 x10E3/uL   Monocytes Absolute 0.4 0.1 - 0.9 x10E3/uL   EOS (ABSOLUTE) 0.1 0.0 - 0.4 x10E3/uL   Basophils Absolute 0.1 0.0 - 0.2 x10E3/uL   Immature Granulocytes 0 Not Estab. %   Immature Grans (Abs) 0.0 0.0 - 0.1 x10E3/uL  Comprehensive metabolic panel with GFR   Collection Time: 08/16/24 10:16 AM  Result Value Ref Range   Glucose 89 70 - 99 mg/dL   BUN 15 8 - 27 mg/dL   Creatinine, Ser 9.08 0.76 - 1.27 mg/dL   eGFR 89 >40 fO/fpw/8.26   BUN/Creatinine Ratio 16 10 - 24   Sodium 137 134 - 144 mmol/L   Potassium 4.9 3.5 - 5.2 mmol/L   Chloride 102 96 - 106 mmol/L   CO2 24 20 - 29 mmol/L   Calcium  9.2 8.6 - 10.2 mg/dL   Total Protein 6.7 6.0 - 8.5 g/dL   Albumin 4.4 3.8 - 4.8 g/dL   Globulin, Total 2.3 1.5 - 4.5 g/dL   Bilirubin Total 0.7 0.0 - 1.2 mg/dL   Alkaline Phosphatase 66 47 - 123 IU/L   AST 22 0 - 40 IU/L   ALT 14 0 - 44 IU/L  .  Assessment & Plan:   Assessment & Plan Mixed hyperlipidemia Well controlled.  No changes to medicines. Continue rosuvastwatin 5 mg before bed.  Continue to work on eating a healthy diet and exercise.  Labs drawn today.  Orders:   POCT Lipid Panel   CBC with Differential/Platelet   Comprehensive metabolic panel with GFR   Impaired fasting glucose Recommend continue to work on eating healthy diet and exercise. At goal.  The current medical regimen is effective;  continue present plan and medications. Orders:   POCT glycosylated hemoglobin (Hb A1C)  Encounter for immunization  Orders:   Pfizer Comirnaty Covid-19 Vaccine  36yrs & older  OSA (obstructive sleep apnea) Continue CPAP with good compliance and control     Nephrolithiasis In bladder. Management per specialist.        Body mass index is 29.95 kg/m.   No orders of the defined types were placed in this encounter.   Orders Placed This Encounter  Procedures   Pfizer Comirnaty Covid-19 Vaccine 41yrs & older   CBC with Differential/Platelet   Comprehensive metabolic panel with GFR   POCT glycosylated hemoglobin (Hb A1C)   POCT Lipid Panel    Follow-up: Return in about 6 months (around 02/14/2025) for chronic follow up.  An After Visit Summary was printed and given to the patient.  Abigail Free, MD [facility] [phone]

## 2024-08-15 NOTE — Assessment & Plan Note (Signed)
 Tyler Giles

## 2024-08-16 ENCOUNTER — Ambulatory Visit: Admitting: Family Medicine

## 2024-08-16 ENCOUNTER — Encounter: Payer: Self-pay | Admitting: Family Medicine

## 2024-08-16 ENCOUNTER — Ambulatory Visit: Payer: Self-pay | Admitting: Family Medicine

## 2024-08-16 VITALS — BP 132/82 | HR 67 | Temp 98.2°F | Ht 68.0 in | Wt 197.0 lb

## 2024-08-16 DIAGNOSIS — G4733 Obstructive sleep apnea (adult) (pediatric): Secondary | ICD-10-CM | POA: Diagnosis not present

## 2024-08-16 DIAGNOSIS — R7301 Impaired fasting glucose: Secondary | ICD-10-CM

## 2024-08-16 DIAGNOSIS — E782 Mixed hyperlipidemia: Secondary | ICD-10-CM

## 2024-08-16 DIAGNOSIS — Z23 Encounter for immunization: Secondary | ICD-10-CM

## 2024-08-16 DIAGNOSIS — N200 Calculus of kidney: Secondary | ICD-10-CM | POA: Diagnosis not present

## 2024-08-16 LAB — COMPREHENSIVE METABOLIC PANEL WITH GFR
ALT: 14 IU/L (ref 0–44)
AST: 22 IU/L (ref 0–40)
Albumin: 4.4 g/dL (ref 3.8–4.8)
Alkaline Phosphatase: 66 IU/L (ref 47–123)
BUN/Creatinine Ratio: 16 (ref 10–24)
BUN: 15 mg/dL (ref 8–27)
Bilirubin Total: 0.7 mg/dL (ref 0.0–1.2)
CO2: 24 mmol/L (ref 20–29)
Calcium: 9.2 mg/dL (ref 8.6–10.2)
Chloride: 102 mmol/L (ref 96–106)
Creatinine, Ser: 0.91 mg/dL (ref 0.76–1.27)
Globulin, Total: 2.3 g/dL (ref 1.5–4.5)
Glucose: 89 mg/dL (ref 70–99)
Potassium: 4.9 mmol/L (ref 3.5–5.2)
Sodium: 137 mmol/L (ref 134–144)
Total Protein: 6.7 g/dL (ref 6.0–8.5)
eGFR: 89 mL/min/1.73 (ref >59)

## 2024-08-16 LAB — CBC WITH DIFFERENTIAL/PLATELET
Basophils Absolute: 0.1 x10E3/uL (ref 0.0–0.2)
Basos: 1 %
EOS (ABSOLUTE): 0.1 x10E3/uL (ref 0.0–0.4)
Eos: 3 %
Hematocrit: 47.7 % (ref 37.5–51.0)
Hemoglobin: 15.7 g/dL (ref 13.0–17.7)
Immature Grans (Abs): 0 x10E3/uL (ref 0.0–0.1)
Immature Granulocytes: 0 %
Lymphocytes Absolute: 0.8 x10E3/uL (ref 0.7–3.1)
Lymphs: 19 %
MCH: 30.5 pg (ref 26.6–33.0)
MCHC: 32.9 g/dL (ref 31.5–35.7)
MCV: 93 fL (ref 79–97)
Monocytes Absolute: 0.4 x10E3/uL (ref 0.1–0.9)
Monocytes: 8 %
Neutrophils Absolute: 3.1 x10E3/uL (ref 1.4–7.0)
Neutrophils: 69 %
Platelets: 184 x10E3/uL (ref 150–450)
RBC: 5.14 x10E6/uL (ref 4.14–5.80)
RDW: 13.7 % (ref 11.6–15.4)
WBC: 4.5 x10E3/uL (ref 3.4–10.8)

## 2024-08-16 LAB — POCT GLYCOSYLATED HEMOGLOBIN (HGB A1C): HbA1c POC (<> result, manual entry): 5.5 % (ref 4.0–5.6)

## 2024-08-16 LAB — POCT LIPID PANEL
HDL: 52
LDL: 98
Non-HDL: 115
TC: 167
TRG: 86

## 2024-08-19 DIAGNOSIS — N200 Calculus of kidney: Secondary | ICD-10-CM | POA: Insufficient documentation

## 2024-08-19 NOTE — Assessment & Plan Note (Signed)
 In bladder. Management per specialist.

## 2024-08-19 NOTE — Assessment & Plan Note (Signed)
 Continue CPAP with good compliance and control

## 2024-08-24 ENCOUNTER — Other Ambulatory Visit: Payer: Self-pay | Admitting: Urology

## 2024-08-29 NOTE — Patient Instructions (Addendum)
 SURGICAL WAITING ROOM VISITATION Patients having surgery or a procedure may have no more than 2 support people in the waiting area - these visitors may rotate in the visitor waiting room.   If the patient needs to stay at the hospital during part of their recovery, the visitor guidelines for inpatient rooms apply.  PRE-OP VISITATION  Pre-op nurse will coordinate an appropriate time for 1 support person to accompany the patient in pre-op.  This support person may not rotate.  This visitor will be contacted when the time is appropriate for the visitor to come back in the pre-op area.  To keep our patients, visitors and teammates safe and prevent the spread of respiratory illnesses over the next few months.  Temporary Visitor Restrictions  Children ages 50 and under will not be able to visit patients in [facility] under most circumstances. Visitation is not restricted outside of hospitals unless noted otherwise in the [facility] and Location Specific Visitation Guidelines at:       [email or web address]  Visitors with respiratory illnesses are discouraged from visiting and should remain at home. You are not required to quarantine at this time prior to your surgery. However, you must do this: Hand Hygiene often Do NOT share personal items Notify your provider if you are in close contact with someone who has COVID or you develop fever 100.4 or greater, new onset of sneezing, cough, sore throat, shortness of breath or body aches.  If you test positive for Covid or have been in contact with anyone that has tested positive in the last 10 days please notify you surgeon.    Your procedure is scheduled on:   FRIDAY  09-14-2024     Report to [facility] Main Entrance: [place] entrance where the [employer] is available.   Report to admitting at:  1:00 PM   Call this number if you have any questions or problems the morning of surgery [phone]   After Midnight the night before  surgery,  you may have Water, or Sports drinks like Gatorade or Powerade (No RED color) until   07:00 am the morning of your surgery.  After 7:00 am, do not drink or eat anything; No candy, chewing gum or throat lozenges.   FOLLOW  ANY ADDITIONAL PRE OP INSTRUCTIONS YOU RECEIVED FROM YOUR SURGEON'S OFFICE!!!   Oral Hygiene is also important to reduce your risk of infection.        Remember - BRUSH YOUR TEETH THE MORNING OF SURGERY WITH YOUR REGULAR TOOTHPASTE  Do NOT smoke after Midnight the night before surgery.  STOP TAKING all Vitamins, Herbs and supplements 1 week before your surgery.   Take ONLY these medicines the morning of surgery with A SIP OF WATER: None,  You may use your Eye Drops if needed.   If You have been diagnosed with Sleep Apnea - Bring CPAP mask and tubing day of surgery. We will provide you with a CPAP machine on the day of your surgery.                   You may not have any metal on your body including jewelry, and body piercing  Do not wear  lotions, powders, cologne, or deodorant  Men may shave face and neck.  Contacts, Hearing Aids, dentures or bridgework may not be worn into surgery. DENTURES WILL BE REMOVED PRIOR TO SURGERY PLEASE DO NOT APPLY Poly grip OR ADHESIVES!!!  Patients discharged on the day of surgery will  not be allowed to drive home.  Someone NEEDS to stay with you for the first 24 hours after anesthesia.  Do not bring your home medications to the hospital. The Pharmacy will dispense medications listed on your medication list to you during your admission in the Hospital.  Please read over the following fact sheets you were given: IF YOU HAVE QUESTIONS ABOUT YOUR PRE-OP INSTRUCTIONS, PLEASE CALL [phone].    [facility] - Preparing for Surgery Before surgery, you can play an important role.  Because skin is not sterile, your skin needs to be as free of germs as possible.  You can reduce the number of germs on your skin by washing with  Antibacterial soap before surgery.  . Do not shave (including legs and underarms) for at least 48 hours prior to the first shower.  You may shave your face/neck.  Please follow these instructions carefully:  1.  Shower with antibacterial Soap the night before surgery and the  morning of surgery.  2.  If you choose to wash your hair, wash your hair first as usual with your normal  shampoo.  3.  After you shampoo, rinse your hair and body thoroughly to remove the shampoo.                             4.  You can apply soap directly to the skin and wash.  Gently with a scrungie or clean washcloth.  5.  Wash face,  Genitals (private parts) with your normal soap.             6.  Wash thoroughly, paying special attention to the area where your  surgery  will be performed.  7.  Thoroughly rinse your body with warm water from the neck down.  8.   Pat yourself dry with a clean towel.             9  Wear clean pajamas.            10 Place clean sheets on your bed the night of your first shower and do not  sleep with pets.  ON THE DAY OF SURGERY : Do not apply any lotions/deodorants the morning of surgery.  Please wear clean clothes to the hospital/surgery center.  FAILURE TO FOLLOW THESE INSTRUCTIONS MAY RESULT IN THE CANCELLATION OF YOUR SURGERY  PATIENT SIGNATURE_________________________________  NURSE SIGNATURE__________________________________

## 2024-08-29 NOTE — Progress Notes (Signed)
 The patient was identified using 2 approved identifiers. All issues noted in this document were discussed and addressed, Tyler Giles voiced understanding and agreement with all preoperative instructions. The patient was emailed the surgery instructions per his request.    [email or web address]  The patient was instructed to call our  Admitting Office [phone] or [phone]) to complete their Pre-surgical Interview.    Medication Recon: 08-28-2024  verified no changes per patient.   COVID Vaccine received:  []  No [x]  Yes Date of any COVID positive Test in last 90 days:  PCP - Abigail Free, MD  Cardiologist - Debby Manner, MD (LOV 11-28-2023) Pulmonary- Reggy Salt, MD  (LOV 01-12-2024)  Chest x-ray - 01-13-2024  Epic EKG -  08-02-2022    Repeat DOS  Stress Test -  ECHO -  Cardiac Cath -  CT Coronary Calcium  score:   PCP did CBC and CMP on 08-16-24 which were both normal. We will use these for his surgery on 09-14-2024  Pacemaker / ICD device [x]  No []  Yes   Spinal Cord Stimulator:[x]  No []  Yes       History of Sleep Apnea? []  No [x]  Yes   CPAP used?- []  No [x]  Yes    Medication on DOS: Eye Drops,   Patient has: []  NO Hx DM   [x]  Pre-DM   []  DM1  []   DM2 Does the patient monitor blood sugar?   []  N/A   [x]  No []  Yes  Last A1c was: 5.7  on  08-16-2024     METFORMIN - HOLD DOS  Blood Thinner / Instructions: Aspirin Instructions:   ASA 81 mg  Activity level: Able to walk up 2 flights of stairs without becoming significantly short of breath or having chest pain?   []    Yes   []  No,  would have:  Patient can perform ADLs without assistance.  []   Yes    []  No  Comments:   Anesthesia review: OSA-CPAP, Pre-DM, cerebral cavernoma  Patient denies any S&S of respiratory illness or Covid - no shortness of breath, fever, cough or chest pain at PAT appointment.  Patient verbalized understanding and agreement to the Pre-Surgical Instructions that were given to them at this PAT  appointment. Patient was also educated of the need to review these PAT instructions again prior to his/her surgery.I reviewed the appropriate phone numbers to call if they have any and questions or concerns.

## 2024-08-30 ENCOUNTER — Other Ambulatory Visit: Payer: Self-pay | Admitting: Family Medicine

## 2024-08-31 ENCOUNTER — Encounter (HOSPITAL_COMMUNITY): Payer: Self-pay

## 2024-08-31 ENCOUNTER — Encounter (HOSPITAL_COMMUNITY)
Admission: RE | Admit: 2024-08-31 | Discharge: 2024-08-31 | Disposition: A | Discharge status: Home / Self Care | Source: Ambulatory Visit | Attending: Urology | Admitting: Urology

## 2024-08-31 VITALS — Ht 68.0 in | Wt 196.0 lb

## 2024-08-31 DIAGNOSIS — Z01818 Encounter for other preprocedural examination: Secondary | ICD-10-CM

## 2024-08-31 HISTORY — DX: Personal history of urinary calculi: Z87.442

## 2024-08-31 HISTORY — DX: Pneumonia, unspecified organism: J18.9

## 2024-08-31 HISTORY — DX: Chronic kidney disease, unspecified: N18.9

## 2024-08-31 HISTORY — DX: Cardiac arrhythmia, unspecified: I49.9

## 2024-09-13 NOTE — Anesthesia Preprocedure Evaluation (Addendum)
 "                                  Anesthesia Evaluation  Patient identified by MRN, date of birth, ID band Patient awake    Reviewed: Allergy & Precautions, NPO status , Patient's Chart, lab work & pertinent test results  History of Anesthesia Complications Negative for: history of anesthetic complications  Airway Mallampati: III  TM Distance: >3 FB Neck ROM: Full    Dental no notable dental hx.    Pulmonary sleep apnea and Continuous Positive Airway Pressure Ventilation    Pulmonary exam normal        Cardiovascular Normal cardiovascular exam+ dysrhythmias      Neuro/Psych  Neuromuscular disease    GI/Hepatic PUD,,,  Endo/Other  diabetes, Well Controlled, Type 2, Oral Hypoglycemic Agents    Renal/GU Renal diseaseBladder stone Lab Results      Component                Value               Date                             K                        4.9                 08/16/2024                   CREATININE               0.91                08/16/2024                             Musculoskeletal  (+) Arthritis ,    Abdominal   Peds  Hematology Lab Results      Component                Value               Date                      WBC                      4.5                 08/16/2024                HGB                      15.7                08/16/2024                HCT                      47.7                08/16/2024                MCV  93                  08/16/2024                PLT                      184                 08/16/2024              Anesthesia Other Findings All : lipitor  Reproductive/Obstetrics                              Anesthesia Physical Anesthesia Plan  ASA: 3  Anesthesia Plan: General   Post-op Pain Management: Ofirmev  IV (intra-op)*   Induction: Intravenous  PONV Risk Score and Plan: 3 and Treatment may vary due to age or medical condition, Midazolam ,  Ondansetron  and Dexamethasone   Airway Management Planned: LMA  Additional Equipment: None  Intra-op Plan:   Post-operative Plan: Extubation in OR  Informed Consent: I have reviewed the patients History and Physical, chart, labs and discussed the procedure including the risks, benefits and alternatives for the proposed anesthesia with the patient or authorized representative who has indicated his/her understanding and acceptance.     Dental advisory given  Plan Discussed with: CRNA  Anesthesia Plan Comments:          Anesthesia Quick Evaluation  "

## 2024-09-13 NOTE — H&P (Addendum)
 Urology Preoperative H&P   Chief Complaint: Bladder stone  History of Present Illness: Tyler Giles is a 74 y.o. male with recurrent bladder stones here for cystolitholopaxy.  Denies fevers, chills, dysuria.    Past Medical History:  Diagnosis Date   Benign localized prostatic hyperplasia with lower urinary tract symptoms (LUTS)    Chronic kidney disease    DDD (degenerative disc disease), cervical    Dysrhythmia    PVCs   ED (erectile dysfunction)    Ground glass opacity present on imaging of lung 11/15/2021   History of kidney stones    Ileostomy in place [facility]) 1991   s/p coloprototectomy for UC   Mixed hyperlipidemia    OA (osteoarthritis)    OSA on CPAP    uses nightly per pt   (followed by dr c young)   Peyronie's disease 2019   completed treatment w/ injections 03/ 2021 by dr ceil   Pneumonia    Pre-diabetes    followed by pcp    (07-28-2022  per pt checks blood sugar 3-4 times daily, stated last A1c  5.6,  takes metformin )   Sleep apnea    UC (ulcerative colitis) (HCC)    s/p  completed colectomy w/ ileostomy 1991   Ulcerative colitis (HCC) 03/15/2008   Annotation: with iliostomy  Qualifier: Diagnosis of   By: Clifford Almarie Aguas urinary stream     Past Surgical History:  Procedure Laterality Date   APPENDECTOMY     CATARACT EXTRACTION W/ INTRAOCULAR LENS IMPLANT Bilateral 2020   COLON SURGERY  1991   Ileostomy   COLOPROCTECTOMY W/ ILEO J POUCH  1991   for UC  (ileostomy RLQ)  w/ appendectomy   EYE SURGERY  2020   Cataract Surgery   INGUINAL HERNIA REPAIR  2002   JOINT REPLACEMENT     KNEE ARTHROSCOPY Left    x3  last one 2009   REPLACEMENT UNICONDYLAR JOINT KNEE Left 09/27/2008   @[facility] [place] by dr raynold   REVISION TOTAL KNEE ARTHROPLASTY Left 09/09/2020   @[facility] [place] by dr shields;   Uni partial TK to TKA   TONSILLECTOMY     age 32   TRANSURETHRAL RESECTION OF PROSTATE N/A 08/02/2022   Procedure: TRANSURETHRAL RESECTION OF  THE PROSTATE (TURP), BIPOLAR;  Surgeon: Selma Donnice SAUNDERS, MD;  Location: [facility];  Service: Urology;  Laterality: N/A;    Allergies: Allergies[1]  Family History  Problem Relation Age of Onset   Cerebrovascular Accident Mother    Acute myelogenous leukemia Father    Prostate cancer Father    Coronary artery disease Maternal Uncle    Coronary artery disease Maternal Grandmother    Stroke Paternal Grandmother    Stroke Paternal Grandfather     Social History:  reports that he has never smoked. He has never used smokeless tobacco. He reports current alcohol use of about 2.0 standard drinks of alcohol per week. He reports that he does not use drugs.  ROS: A complete review of systems was performed.  All systems are negative except for pertinent findings as noted.  Physical Exam:  Vital signs in last 24 hours:   Constitutional:  Alert and oriented, No acute distress Cardiovascular: Regular rate and rhythm Respiratory: Normal respiratory effort, Lungs clear bilaterally GI: Abdomen is soft, nontender, nondistended, no abdominal masses GU: No CVA tenderness Lymphatic: No lymphadenopathy Neurologic: Grossly intact, no focal deficits Psychiatric: Normal mood and affect  Laboratory Data:  No results for input(s): WBC, HGB, HCT, PLT in the last 72 hours.  No results for input(s): NA, K, CL, GLUCOSE, BUN, CALCIUM , CREATININE in the last 72 hours.  Invalid input(s): CO3   No results found for this or any previous visit (from the past 24 hours). No results found for this or any previous visit (from the past 240 hours).  Renal Function: No results for input(s): CREATININE in the last 168 hours. CrCl cannot be calculated (Patient's most recent lab result is older than the maximum 21 days allowed.).  Radiologic Imaging: No results found.  I independently reviewed the above imaging studies.  Assessment and Plan Tyler Giles is a 74  y.o. male with recurrent bladder stones here for cystolitholapaxy.  Tyler R. Donya Tomaro MD 09/14/2024, 11:15 AM  [facility] Pager: [phone]): [phone]     [1]  Allergies Allergen Reactions   Lipitor [Atorvastatin] Other (See Comments)    Muscle cramps

## 2024-09-14 ENCOUNTER — Encounter (HOSPITAL_COMMUNITY): Payer: Self-pay | Admitting: Urology

## 2024-09-14 ENCOUNTER — Ambulatory Visit (HOSPITAL_COMMUNITY): Admitting: Anesthesiology

## 2024-09-14 ENCOUNTER — Other Ambulatory Visit: Payer: Self-pay

## 2024-09-14 ENCOUNTER — Encounter (HOSPITAL_COMMUNITY)
Admission: RE | Disposition: A | Discharge status: Home / Self Care | Payer: Self-pay | Source: Ambulatory Visit | Attending: Urology

## 2024-09-14 ENCOUNTER — Ambulatory Visit (HOSPITAL_COMMUNITY)
Admission: RE | Admit: 2024-09-14 | Discharge: 2024-09-14 | Disposition: A | Discharge status: Home / Self Care | Source: Ambulatory Visit | Attending: Urology | Admitting: Urology

## 2024-09-14 DIAGNOSIS — Z8711 Personal history of peptic ulcer disease: Secondary | ICD-10-CM | POA: Diagnosis not present

## 2024-09-14 DIAGNOSIS — N210 Calculus in bladder: Secondary | ICD-10-CM

## 2024-09-14 DIAGNOSIS — E785 Hyperlipidemia, unspecified: Secondary | ICD-10-CM

## 2024-09-14 DIAGNOSIS — G4733 Obstructive sleep apnea (adult) (pediatric): Secondary | ICD-10-CM | POA: Insufficient documentation

## 2024-09-14 DIAGNOSIS — Z01818 Encounter for other preprocedural examination: Secondary | ICD-10-CM

## 2024-09-14 DIAGNOSIS — Z7984 Long term (current) use of oral hypoglycemic drugs: Secondary | ICD-10-CM | POA: Insufficient documentation

## 2024-09-14 DIAGNOSIS — N35912 Unspecified bulbous urethral stricture, male: Secondary | ICD-10-CM | POA: Insufficient documentation

## 2024-09-14 DIAGNOSIS — E119 Type 2 diabetes mellitus without complications: Secondary | ICD-10-CM | POA: Insufficient documentation

## 2024-09-14 HISTORY — PX: CYSTOSCOPY WITH LITHOLAPAXY: SHX1425

## 2024-09-14 HISTORY — PX: CYSTOSCOPY: SHX5120

## 2024-09-14 LAB — CBC
HCT: 50.9 % (ref 39.0–52.0)
Hemoglobin: 16.7 g/dL (ref 13.0–17.0)
MCH: 30.4 pg (ref 26.0–34.0)
MCHC: 32.8 g/dL (ref 30.0–36.0)
MCV: 92.7 fL (ref 80.0–100.0)
Platelets: 203 K/uL (ref 150–400)
RBC: 5.49 MIL/uL (ref 4.22–5.81)
RDW: 13.7 % (ref 11.5–15.5)
WBC: 6.4 K/uL (ref 4.0–10.5)
nRBC: 0 % (ref 0.0–0.2)

## 2024-09-14 LAB — GLUCOSE, CAPILLARY: Glucose-Capillary: 95 mg/dL (ref 70–99)

## 2024-09-14 SURGERY — CYSTOSCOPY, WITH BLADDER CALCULUS LITHOLAPAXY
Anesthesia: General

## 2024-09-14 MED ORDER — DEXAMETHASONE SOD PHOSPHATE PF 10 MG/ML IJ SOLN
INTRAMUSCULAR | Status: DC | PRN
  Administered 2024-09-14: 10 mg via INTRAVENOUS

## 2024-09-14 MED ORDER — PROPOFOL 10 MG/ML IV BOLUS
INTRAVENOUS | Status: DC | PRN
  Administered 2024-09-14: 200 mg via INTRAVENOUS

## 2024-09-14 MED ORDER — LIDOCAINE HCL (PF) 2 % IJ SOLN
INTRAMUSCULAR | Status: AC
  Filled 2024-09-14: qty 5

## 2024-09-14 MED ORDER — ORAL CARE MOUTH RINSE: 15.0000 mL | Freq: Once | OROMUCOSAL | Status: AC

## 2024-09-14 MED ORDER — CEFAZOLIN SODIUM-DEXTROSE 2-4 GM/100ML-% IV SOLN
2.0000 g | INTRAVENOUS | Status: AC
  Administered 2024-09-14: 2 g via INTRAVENOUS
  Filled 2024-09-14: qty 100

## 2024-09-14 MED ORDER — SODIUM CHLORIDE 0.9 % IR SOLN
Status: DC | PRN
  Administered 2024-09-14: 6000 mL via INTRAVESICAL

## 2024-09-14 MED ORDER — OXYCODONE-ACETAMINOPHEN 5-325 MG PO TABS: 1.0000 | ORAL_TABLET | ORAL | 0 refills | Status: DC | PRN

## 2024-09-14 MED ORDER — PROPOFOL 10 MG/ML IV BOLUS
INTRAVENOUS | Status: AC
  Filled 2024-09-14: qty 20

## 2024-09-14 MED ORDER — LIDOCAINE HCL (PF) 2 % IJ SOLN
INTRAMUSCULAR | Status: DC | PRN
  Administered 2024-09-14: 100 mg via INTRADERMAL

## 2024-09-14 MED ORDER — FENTANYL CITRATE (PF) 100 MCG/2ML IJ SOLN
INTRAMUSCULAR | Status: DC | PRN
  Administered 2024-09-14: 100 ug via INTRAVENOUS

## 2024-09-14 MED ORDER — LACTATED RINGERS IV SOLN: INTRAVENOUS | Status: DC

## 2024-09-14 MED ORDER — OXYCODONE HCL 5 MG PO TABS: 5.0000 mg | ORAL_TABLET | Freq: Once | ORAL | Status: DC | PRN

## 2024-09-14 MED ORDER — ONDANSETRON HCL 4 MG/2ML IJ SOLN: 4.0000 mg | Freq: Once | INTRAMUSCULAR | Status: DC | PRN

## 2024-09-14 MED ORDER — ONDANSETRON HCL 4 MG/2ML IJ SOLN
INTRAMUSCULAR | Status: AC
  Filled 2024-09-14: qty 2

## 2024-09-14 MED ORDER — FENTANYL CITRATE (PF) 100 MCG/2ML IJ SOLN
INTRAMUSCULAR | Status: AC
  Filled 2024-09-14: qty 2

## 2024-09-14 MED ORDER — FENTANYL CITRATE (PF) 50 MCG/ML IJ SOSY: 25.0000 ug | PREFILLED_SYRINGE | INTRAMUSCULAR | Status: DC | PRN

## 2024-09-14 MED ORDER — ACETAMINOPHEN 10 MG/ML IV SOLN: 1000.0000 mg | Freq: Once | INTRAVENOUS | Status: DC | PRN

## 2024-09-14 MED ORDER — DEXAMETHASONE SOD PHOSPHATE PF 10 MG/ML IJ SOLN
INTRAMUSCULAR | Status: AC
  Filled 2024-09-14: qty 1

## 2024-09-14 MED ORDER — ONDANSETRON HCL 4 MG/2ML IJ SOLN
INTRAMUSCULAR | Status: DC | PRN
  Administered 2024-09-14: 4 mg via INTRAVENOUS

## 2024-09-14 MED ORDER — OXYCODONE HCL 5 MG/5ML PO SOLN: 5.0000 mg | Freq: Once | ORAL | Status: DC | PRN

## 2024-09-14 MED ORDER — CHLORHEXIDINE GLUCONATE 0.12 % MT SOLN
15.0000 mL | Freq: Once | OROMUCOSAL | Status: AC
  Administered 2024-09-14: 15 mL via OROMUCOSAL

## 2024-09-14 SURGICAL SUPPLY — 20 items
BAG URO CATCHER STRL LF (MISCELLANEOUS) ×1 IMPLANT
CATH FOLEY 2W COUNCIL 5CC 18FR (CATHETERS) IMPLANT
CATH SET URETHRAL DILATOR (CATHETERS) IMPLANT
CATH URETL OPEN 5X70 (CATHETERS) IMPLANT
CLOTH BEACON ORANGE TIMEOUT ST (SAFETY) ×1 IMPLANT
GLOVE BIO SURGEON STRL SZ7 (GLOVE) ×1 IMPLANT
GLOVE BIOGEL M 7.0 STRL (GLOVE) ×1 IMPLANT
GOWN STRL REUS W/ TWL XL LVL3 (GOWN DISPOSABLE) ×1 IMPLANT
GUIDEWIRE STR DUAL SENSOR (WIRE) ×1 IMPLANT
GUIDEWIRE ZIPWRE .038 STRAIGHT (WIRE) IMPLANT
KIT TURNOVER KIT A (KITS) ×1 IMPLANT
LASER FIB FLEXIVA PULSE ID 550 (Laser) IMPLANT
LASER FIB FLEXIVA PULSE ID 910 (Laser) IMPLANT
MANIFOLD NEPTUNE II (INSTRUMENTS) ×1 IMPLANT
PACK CYSTO (CUSTOM PROCEDURE TRAY) ×1 IMPLANT
SHEATH DILATOR SET 8/10 (MISCELLANEOUS) IMPLANT
SYR 10ML LL (SYRINGE) ×1 IMPLANT
SYRINGE TOOMEY IRRIG 70ML (MISCELLANEOUS) IMPLANT
TUBING CONNECTING 10 (TUBING) ×1 IMPLANT
TUBING UROLOGY SET (TUBING) ×1 IMPLANT

## 2024-09-14 NOTE — Op Note (Signed)
 Operative Note  Preoperative diagnosis:  1.  Bladder stone  Postoperative diagnosis: 1.  Bladder stone 2. Urethral stricture  Procedure(s): 1.  Cystolitholapaxy >3cm 2. Urethral dilation  Surgeon: Donnice Siad, MD  Assistants:  None  Anesthesia:  General  Complications:  None  EBL:  Minimal  Specimens: 1. None  Drains/Catheters: 1.  18 French council catheter  Intraoperative findings:   Several large bladder stones with total stone burden about 3 cm.  Indication:  Tyler Giles is a 73 y.o. male with history of BPH and bladder stones.  He has previously undergone TURP.  He is voiding with strong flow stream however recently passed a bladder stone.  Cystoscopy in the office revealed several large bladder stones total stone burden about 3 cm.  He presents today for cystolitholapaxy.  Description of procedure: The indication, alternatives, benefits and risks were discussed with the patient and informed consent was obtained.  Patient was brought to the operating room table, positioned supine, secured with a safety strap.  Pneumatic compression devices were placed on the lower extremities.  After the administration of intravenous antibiotics and general anesthesia, the patient was repositioned into the dorsal lithotomy position.  All pressure points were carefully padded.  A rectal examination was performed confirming a smooth symmetric enlarged gland.  The genitalia were prepped and draped in standard sterile manner.  A timeout was completed, verifying the correct patient, surgical procedure and positioning prior to beginning the procedure.  Isotonic sodium chloride  was used for irrigation.  A continuous-flow laser bridge scope with visual obturator and 30 degree lens was advanced under direct vision into the bladder. I identified an approximate 24 French bulbar urethral stricture. A 0.038 sensor wire was passed through the lumen and this was dilated from 16French to 24 French. The  prostate was nonobstructing with evidence of prior TUR defect with minor regrowth anteriorly. On cystoscopic evaluation, his bladder capacity appeared normal, the bladder wall was noted to expand symmetrically in all dimensions.  There were no tumors present.  He did have several large bladder stones with a total stone burden over 3 cm.  I next passed a 1000 m holmium laser fiber through the laser bridge and proceeded to fragment all of the bladder stones.  Once these were small fragments, all stone debris was irrigated from the bladder.  There is induration of the bladder confirming excellent hemostasis.  There is no active bleeding.  Upon completion of the entire procedure, the bladder and urethra were examined confirming no evidence of bleeding.  Both ureteral orifices and the external sphincter were noted to be intact.  The scope was drawn under direct vision.  At the end of the procedure, all counts were correct.  Patient tolerated the procedure well and was taken to the recovery room satisfactory condition. He will remove foley catheter on Monday AM.  [name] MD [facility]  Pager: [phone]

## 2024-09-14 NOTE — Transfer of Care (Signed)
 Immediate Anesthesia Transfer of Care Note  Patient: Tyler Giles  Procedure(s) Performed: CYSTOSCOPY, WITH BLADDER CALCULUS LITHOLAPAXY CYSTOSCOPY  Patient Location: PACU  Anesthesia Type:General  Level of Consciousness: sedated  Airway & Oxygen Therapy: Patient Spontanous Breathing and Patient connected to face mask oxygen  Post-op Assessment: Report given to RN and Post -op Vital signs reviewed and stable  Post vital signs: Reviewed and stable  Last Vitals:  Vitals Value Taken Time  BP    Temp    Pulse 65 09/14/24 16:31  Resp 10 09/14/24 16:31  SpO2 100 % 09/14/24 16:31  Vitals shown include unfiled device data.  Last Pain:  Vitals:   09/14/24 1333  TempSrc: Oral  PainSc: 0-No pain         Complications: No notable events documented.

## 2024-09-14 NOTE — Anesthesia Procedure Notes (Signed)
 Procedure Name: LMA Insertion Date/Time: 09/14/2024 4:03 PM  Performed by: Carleton Garnette SAUNDERS, CRNAPre-anesthesia Checklist: Patient identified, Emergency Drugs available, Suction available and Patient being monitored Patient Re-evaluated:Patient Re-evaluated prior to induction Oxygen Delivery Method: Circle system utilized Preoxygenation: Pre-oxygenation with 100% oxygen Induction Type: IV induction LMA: LMA inserted LMA Size: 4.0 Tube type: Oral Number of attempts: 1 Placement Confirmation: positive ETCO2 and breath sounds checked- equal and bilateral Tube secured with: Tape Dental Injury: Teeth and Oropharynx as per pre-operative assessment

## 2024-09-14 NOTE — Anesthesia Postprocedure Evaluation (Signed)
"   Anesthesia Post Note  Patient: Tyler Giles  Procedure(s) Performed: CYSTOSCOPY, WITH BLADDER CALCULUS LITHOLAPAXY CYSTOSCOPY     Patient location during evaluation: PACU Anesthesia Type: General Level of consciousness: awake and alert Pain management: pain level controlled Vital Signs Assessment: post-procedure vital signs reviewed and stable Respiratory status: spontaneous breathing, nonlabored ventilation and respiratory function stable Cardiovascular status: blood pressure returned to baseline Postop Assessment: no apparent nausea or vomiting Anesthetic complications: no   No notable events documented.  Last Vitals:  Vitals:   09/14/24 1700 09/14/24 1715  BP: (!) 162/91 (!) 160/94  Pulse: 67   Resp: 14   Temp: 36.4 C 36.4 C  SpO2: 98%     Last Pain:  Vitals:   09/14/24 1645  TempSrc:   PainSc: 0-No pain   Pain Goal:                   Vertell Row      "

## 2024-09-14 NOTE — Discharge Instructions (Signed)
 Activity:  You are encouraged to ambulate frequently (about every hour during waking hours) to help prevent blood clots from forming in your legs or lungs.    Diet: You should advance your diet as instructed by your physician.  It will be normal to have some bloating, nausea, and abdominal discomfort intermittently.  Prescriptions:  You will be provided a prescription for pain medication to take as needed.  If your pain is not severe enough to require the prescription pain medication, you may take extra strength Tylenol  instead which will have less side effects.  You should also take a prescribed stool softener to avoid straining with bowel movements as the prescription pain medication may constipate you.  What to call us  about: You should call the office [phone]) if you develop fever > 101 or develop persistent vomiting. Activity:  You are encouraged to ambulate frequently (about every hour during waking hours) to help prevent blood clots from forming in your legs or lungs.    You have a foley catheter in place. You may remove foley catheter on Monday AM with provided 10cc syringe.

## 2024-09-15 ENCOUNTER — Encounter (HOSPITAL_COMMUNITY): Payer: Self-pay | Admitting: Urology

## 2024-09-22 ENCOUNTER — Encounter (HOSPITAL_COMMUNITY): Payer: Self-pay

## 2024-09-22 ENCOUNTER — Emergency Department (HOSPITAL_COMMUNITY)

## 2024-09-22 ENCOUNTER — Other Ambulatory Visit: Payer: Self-pay

## 2024-09-22 ENCOUNTER — Emergency Department (HOSPITAL_COMMUNITY)
Admission: EM | Admit: 2024-09-22 | Discharge: 2024-09-23 | Disposition: A | Attending: Emergency Medicine | Admitting: Emergency Medicine

## 2024-09-22 DIAGNOSIS — Z791 Long term (current) use of non-steroidal anti-inflammatories (NSAID): Secondary | ICD-10-CM | POA: Insufficient documentation

## 2024-09-22 DIAGNOSIS — Z7982 Long term (current) use of aspirin: Secondary | ICD-10-CM | POA: Diagnosis not present

## 2024-09-22 DIAGNOSIS — Z7984 Long term (current) use of oral hypoglycemic drugs: Secondary | ICD-10-CM | POA: Diagnosis not present

## 2024-09-22 DIAGNOSIS — Z79899 Other long term (current) drug therapy: Secondary | ICD-10-CM | POA: Insufficient documentation

## 2024-09-22 DIAGNOSIS — I4510 Unspecified right bundle-branch block: Secondary | ICD-10-CM | POA: Diagnosis not present

## 2024-09-22 DIAGNOSIS — I493 Ventricular premature depolarization: Secondary | ICD-10-CM | POA: Diagnosis not present

## 2024-09-22 DIAGNOSIS — H538 Other visual disturbances: Secondary | ICD-10-CM | POA: Diagnosis present

## 2024-09-22 DIAGNOSIS — H53431 Sector or arcuate defects, right eye: Secondary | ICD-10-CM | POA: Insufficient documentation

## 2024-09-22 LAB — CBC
HCT: 48.2 % (ref 39.0–52.0)
Hemoglobin: 16.3 g/dL (ref 13.0–17.0)
MCH: 31 pg (ref 26.0–34.0)
MCHC: 33.8 g/dL (ref 30.0–36.0)
MCV: 91.8 fL (ref 80.0–100.0)
Platelets: 189 K/uL (ref 150–400)
RBC: 5.25 MIL/uL (ref 4.22–5.81)
RDW: 13.4 % (ref 11.5–15.5)
WBC: 8.4 K/uL (ref 4.0–10.5)
nRBC: 0 % (ref 0.0–0.2)

## 2024-09-22 LAB — DIFFERENTIAL
Abs Immature Granulocytes: 0.06 K/uL (ref 0.00–0.07)
Basophils Absolute: 0.1 K/uL (ref 0.0–0.1)
Basophils Relative: 1 %
Eosinophils Absolute: 0.1 K/uL (ref 0.0–0.5)
Eosinophils Relative: 1 %
Immature Granulocytes: 1 %
Lymphocytes Relative: 13 %
Lymphs Abs: 1.1 K/uL (ref 0.7–4.0)
Monocytes Absolute: 0.7 K/uL (ref 0.1–1.0)
Monocytes Relative: 9 %
Neutro Abs: 6.4 K/uL (ref 1.7–7.7)
Neutrophils Relative %: 75 %

## 2024-09-22 LAB — COMPREHENSIVE METABOLIC PANEL WITH GFR
ALT: 15 U/L (ref 0–44)
AST: 27 U/L (ref 15–41)
Albumin: 4.2 g/dL (ref 3.5–5.0)
Alkaline Phosphatase: 83 U/L (ref 38–126)
Anion gap: 11 (ref 5–15)
BUN: 22 mg/dL (ref 8–23)
CO2: 26 mmol/L (ref 22–32)
Calcium: 9.6 mg/dL (ref 8.9–10.3)
Chloride: 100 mmol/L (ref 98–111)
Creatinine, Ser: 1.15 mg/dL (ref 0.61–1.24)
GFR, Estimated: >60 mL/min
Glucose, Bld: 87 mg/dL (ref 70–99)
Potassium: 4.5 mmol/L (ref 3.5–5.1)
Sodium: 137 mmol/L (ref 135–145)
Total Bilirubin: 0.4 mg/dL (ref 0.0–1.2)
Total Protein: 6.9 g/dL (ref 6.5–8.1)

## 2024-09-22 LAB — I-STAT CHEM 8, ED
BUN: 24 mg/dL — ABNORMAL HIGH (ref 8–23)
Calcium, Ion: 1.23 mmol/L (ref 1.15–1.40)
Chloride: 101 mmol/L (ref 98–111)
Creatinine, Ser: 1.2 mg/dL (ref 0.61–1.24)
Glucose, Bld: 89 mg/dL (ref 70–99)
HCT: 48 % (ref 39.0–52.0)
Hemoglobin: 16.3 g/dL (ref 13.0–17.0)
Potassium: 4.4 mmol/L (ref 3.5–5.1)
Sodium: 138 mmol/L (ref 135–145)
TCO2: 25 mmol/L (ref 22–32)

## 2024-09-22 LAB — ETHANOL: Alcohol, Ethyl (B): <15 mg/dL

## 2024-09-22 LAB — APTT: aPTT: 28 s (ref 24–36)

## 2024-09-22 LAB — PROTIME-INR
INR: 0.9 (ref 0.8–1.2)
Prothrombin Time: 12.8 s (ref 11.4–15.2)

## 2024-09-22 MED ORDER — IOHEXOL 350 MG/ML SOLN
75.0000 mL | Freq: Once | INTRAVENOUS | Status: AC | PRN
  Administered 2024-09-22: 75 mL via INTRAVENOUS

## 2024-09-22 MED ORDER — SODIUM CHLORIDE 0.9% FLUSH: 3.0000 mL | Freq: Once | INTRAVENOUS | Status: DC

## 2024-09-22 NOTE — ED Provider Notes (Signed)
 " [place] EMERGENCY DEPARTMENT AT [facility] Provider Note   CSN: [id number] Arrival date & time: 09/22/24  1751     Patient presents with: No chief complaint on file.   Tyler Giles is a 74 y.o. male.   74 yo M with a chief complaints of right lower vision loss.  He said he was putting sheet music up to start playing the piano and he realized that the vision in his right lower eye had become blurry.  He thinks it lasted for about 20 to 30 seconds.  He does not think he does it at all when he closed his right eye.  He denied one-sided numbness or weakness or difficulty speech or swallowing.  Denied headache denied eye pain.  He called his ophthalmologist who recommended coming to the hospital for evaluation.        Prior to Admission medications  Medication Sig Start Date End Date Taking? Authorizing Provider  aspirin 81 MG EC tablet Take 81 mg by mouth every other day. In the evening.    [provider]  carboxymethylcellulose (LUBRICANT EYE DROPS) 0.5 % SOLN Place 1-2 drops into both eyes 3 (three) times daily as needed (dry/irritated eyes.).    [provider]  celecoxib  (CELEBREX ) 200 MG capsule TAKE 1 CAPSULE BY MOUTH EVERY DAY Patient taking differently: Take 200 mg by mouth daily as needed (pain (golfing)). 10/16/23   Cox, Abigail, MD  Coenzyme Q10 (CO Q-10 PO) Take 1 capsule by mouth every other day. In the evening.    [provider]  finasteride  (PROSCAR ) 5 MG tablet TAKE 1 TABLET BY MOUTH ONCE DAILY Patient taking differently: Take 5 mg by mouth at bedtime. 12/02/20   Cox, Abigail, MD  metFORMIN  (GLUCOPHAGE -XR) 500 MG 24 hr tablet TAKE 1 TABLET BY MOUTH TWICE A DAY 08/30/24   Cox, Kirsten, MD  Omega-3 Fatty Acids (FISH OIL) 1000 MG CAPS Take 1 capsule by mouth every other day. In the evening.    [provider]  oxyCODONE -acetaminophen  (PERCOCET) 5-325 MG tablet Take 1 tablet by mouth every 4 (four) hours as needed for up to  12 doses for severe pain (pain score 7-10). 09/14/24   Selma Donnice SAUNDERS, MD  rosuvastatin  (CRESTOR ) 5 MG tablet Take 5 mg by mouth every evening. 01/21/24   [provider]    Allergies: Lipitor [atorvastatin]    Review of Systems  Updated Vital Signs BP (!) 157/90 (BP Location: Left Arm)   Pulse 77   Temp 98.7 F (37.1 C) (Oral)   Resp 18   Ht 5' 8 (1.727 m)   Wt 88 kg   SpO2 97%   BMI 29.50 kg/m   Physical Exam Vitals and nursing note reviewed.  Constitutional:      Appearance: He is well-developed.  HENT:     Head: Normocephalic and atraumatic.  Eyes:     Pupils: Pupils are equal, round, and reactive to light.  Neck:     Vascular: No JVD.  Cardiovascular:     Rate and Rhythm: Normal rate and regular rhythm.     Heart sounds: No murmur heard.    No friction rub. No gallop.  Pulmonary:     Effort: No respiratory distress.     Breath sounds: No wheezing.  Abdominal:     General: There is no distension.     Tenderness: There is no abdominal tenderness. There is no guarding or rebound.  Musculoskeletal:  General: Normal range of motion.     Cervical back: Normal range of motion and neck supple.  Skin:    Coloration: Skin is not pale.     Findings: No rash.  Neurological:     Mental Status: He is alert and oriented to person, place, and time.     GCS: GCS eye subscore is 4. GCS verbal subscore is 5. GCS motor subscore is 6.     Cranial Nerves: Cranial nerves 2-12 are intact.     Sensory: Sensation is intact.     Motor: Motor function is intact.     Coordination: Coordination is intact.     Comments: Benign neuro exam  Psychiatric:        Behavior: Behavior normal.     (all labs ordered are listed, but only abnormal results are displayed) Labs Reviewed  I-STAT CHEM 8, ED - Abnormal; Notable for the following components:      Result Value   BUN 24 (*)    All other components within normal limits  PROTIME-INR  APTT  CBC  DIFFERENTIAL   COMPREHENSIVE METABOLIC PANEL WITH GFR  ETHANOL  CBG MONITORING, ED    EKG: EKG Interpretation Date/Time:  Saturday September 22 2024 18:03:28 EST Ventricular Rate:  95 PR Interval:  178 QRS Duration:  100 QT Interval:  356 QTC Calculation: 447 R Axis:   61  Text Interpretation: Sinus rhythm with frequent Premature ventricular complexes Possible Left atrial enlargement Incomplete right bundle branch block Nonspecific ST abnormality Abnormal ECG No significant change since last tracing Confirmed by Emil Share [phone]) on 09/22/2024 9:18:44 PM  Radiology: CT HEAD WO CONTRAST Result Date: 09/22/2024 EXAM: CT HEAD WITHOUT CONTRAST 09/22/2024 06:45:39 PM TECHNIQUE: CT of the head was performed without the administration of intravenous contrast. Automated exposure control, iterative reconstruction, and/or weight based adjustment of the mA/kV was utilized to reduce the radiation dose to as low as reasonably achievable. COMPARISON: None available. CLINICAL HISTORY: lost vision in lower 2/3 of R eye for approximately 20-30 seconds, now resolved FINDINGS: BRAIN AND VENTRICLES: No acute hemorrhage. No evidence of acute infarct. No hydrocephalus. No extra-axial collection. No mass effect or midline shift. Intracranial atherosclerosis. ORBITS: No acute abnormality. SINUSES: No acute abnormality. SOFT TISSUES AND SKULL: No acute soft tissue abnormality. No skull fracture. IMPRESSION: 1. No acute intracranial abnormality. Electronically signed by: Pinkie Pebbles MD 09/22/2024 06:54 PM EST RP Workstation: [id number]     Procedures   Medications Ordered in the ED  sodium chloride  flush (NS) 0.9 % injection 3 mL (has no administration in time range)  iohexol  (OMNIPAQUE ) 350 MG/ML injection 75 mL (75 mLs Intravenous Contrast Given 09/22/24 2257)                                    Medical Decision Making Amount and/or Complexity of Data Reviewed Radiology: ordered.  Risk Prescription drug  management.   74 yo M with a chief complaints of right lower visual field change.  This started suddenly and lasted for about 20 to 30 seconds and now is completely resolved.  He had no other noted neurologic deficit at that time.  CT of the head is negative for acute intracranial pathology.  Has benign neurologic exam for me.  I discussed case with Dr. Michaela, neurology he recommended CT angio head and neck and MRI of the brain.  If these are reassuring he thought reasonable for  outpatient follow-up.  Patient care signed out to Dr. Theadore, please see their note for further details of care in the ED.  The patients results and plan were reviewed and discussed.   Any x-rays performed were independently reviewed by myself.   Differential diagnosis were considered with the presenting HPI.  Medications  sodium chloride  flush (NS) 0.9 % injection 3 mL (has no administration in time range)  iohexol  (OMNIPAQUE ) 350 MG/ML injection 75 mL (75 mLs Intravenous Contrast Given 09/22/24 2257)    Vitals:   09/22/24 1757 09/22/24 1804 09/22/24 2117 09/22/24 2122  BP: (!) 173/92   (!) 157/90  Pulse: 86   77  Resp: 16   18  Temp: 98.2 F (36.8 C)   98.7 F (37.1 C)  TempSrc:    Oral  SpO2: 96%  99% 97%  Weight:  88 kg    Height:  5' 8 (1.727 m)      Final diagnoses:  Sector associate professor defect, right        Final diagnoses:  Sector visual field defect, right    ED Discharge Orders     None          Emil Share, DO 09/22/24 2308  "

## 2024-09-22 NOTE — ED Triage Notes (Signed)
 Pt states at 1630 pt states had right bottom eye vision loss for 20-30 seconds. Denies visual issues on arrival. Denies headaches, numbness,weakness, tingling,dizziness. Axox4. Pt ambulatory.

## 2024-09-22 NOTE — ED Provider Notes (Signed)
" °  Provider Note MRN:  [id number]  Arrival date & time: 09/22/24    ED Course and Medical Decision Making  Assumed care of patient at sign-out or upon transfer.  Right lower visual field vision loss today awaiting MRI to evaluate for stroke.  Per neurology, if negative can be discharged home with follow-up.  Procedures  Final Clinical Impressions(s) / ED Diagnoses     ICD-10-CM   1. Sector administrator, civil service, right  [id number]       ED Discharge Orders     None       Discharge Instructions   None     Ozell HERO. Theadore, MD [facility] Emergency Medicine [facility] [email or web address]  "

## 2024-09-22 NOTE — ED Provider Triage Note (Signed)
 Emergency Medicine Provider Triage Evaluation Note  DA AUTHEMENT , a 74 y.o. male  was evaluated in triage.  Pt complains of 20 to 30 seconds of vision loss in his right eye earlier today.  Symptoms are now resolved.  Denies facial droop, slurred speech, issues of bowels or coordination.  No previous history of stroke.  Patient is currently not on a blood thinning medication.   Review of Systems  Positive: Right eye vision loss Negative: Facial droop, slurred speech, issues of balance or coordination  Physical Exam  BP (!) 173/92 (BP Location: Right Arm)   Pulse 86   Temp 98.2 F (36.8 C)   Resp 16   Ht 5' 8 (1.727 m)   Wt 88 kg   SpO2 96%   BMI 29.50 kg/m  Gen:   Awake, no distress   Resp:  Normal effort  MSK:   Moves extremities without difficulty  Other:  Grossly intact neurologic examination on triage  Medical Decision Making  Medically screening exam initiated at 6:30 PM.  Appropriate orders placed.  Elsie JAYSON Miles was informed that the remainder of the evaluation will be completed by another provider, this initial triage assessment does not replace that evaluation, and the importance of remaining in the ED until their evaluation is complete.  Orders: CBC, CMP, i-STAT Chem-8, CBG, PT/INR, PTT, ethanol, differential, CT head, EKG   Janetta Terrall FALCON, [place] 09/22/24 1833

## 2024-09-23 NOTE — Discharge Instructions (Signed)
 You were evaluated in the Emergency Department and after careful evaluation, we did not find any emergent condition requiring admission or further testing in the hospital.  Your exam/testing today is overall reassuring.  MRI did not show any signs of stroke.  Recommend follow-up with neurology as well as ophthalmology.  Recommend restarting your aspirin.  Please return to the Emergency Department if you experience any worsening of your condition.   Thank you for allowing us  to be a part of your care.

## 2024-09-25 LAB — POCT ERYTHROCYTE SEDIMENTATION RATE, NON-AUTOMATED: Sed Rate: 5

## 2024-09-25 LAB — LAB REPORT - SCANNED: CRP: 2

## 2024-09-27 ENCOUNTER — Ambulatory Visit: Admitting: Family Medicine

## 2024-09-27 VITALS — BP 130/80 | HR 58 | Temp 98.0°F | Ht 68.0 in | Wt 200.0 lb

## 2024-09-27 DIAGNOSIS — E782 Mixed hyperlipidemia: Secondary | ICD-10-CM

## 2024-09-27 DIAGNOSIS — I10 Essential (primary) hypertension: Secondary | ICD-10-CM

## 2024-09-27 DIAGNOSIS — G459 Transient cerebral ischemic attack, unspecified: Secondary | ICD-10-CM

## 2024-09-27 NOTE — Assessment & Plan Note (Addendum)
 LDL cholesterol level is 98 mg/dL. Goal is to reduce LDL to less than 55 mg/dL due to potential cardiovascular risk. Currently on a low dose of cholesterol medication and tolerating it well. - Increased crestor  to 10 mg to achieve LDL goal of less than 55 mg/dL. Patient has enough at home.

## 2024-09-27 NOTE — Assessment & Plan Note (Addendum)
 Experienced transient vision loss in the left eye. Extensive workup including CT, MRI, and CTA showed no abnormalities. Differential diagnosis includes temporal arteritis and an embolic event but these were not confirmed. Echocardiogram not performed, which could help identify intermittent atrial fibrillation as a potential source of emboli. Discussed the possibility of a TIA or mini-stroke, with no clear source identified. Emphasized the importance of immediate medical attention if symptoms recur, as TPA is effective within four hours of stroke onset. - Ordered echocardiogram to evaluate for intermittent atrial fibrillation. - Continue aspirin therapy. Orders:   ECHOCARDIOGRAM COMPLETE; Future

## 2024-09-27 NOTE — Progress Notes (Signed)
 "  Subjective:  Patient ID: Tyler Giles, male    DOB: Dec 10, 1950  Age: 74 y.o. MRN: [id number]  Chief Complaint  Patient presents with   Medical Management of Chronic Issues    HPI: Discussed the use of AI scribe software for clinical note transcription with the patient, who gave verbal consent to proceed.  History of Present Illness ADRYEN Giles is a 74 year old male who presents with transient vision loss in the right eye.  Transient monocular vision loss - Transient episode of vision loss in the lower half of the right eye lasting 20-30 seconds - Occurred while reading sheet music at home - Described as loss of the bottom half of the visual field in the right eye - No recurrence of vision loss reported  Diagnostic evaluation - CT of brain and an MRI of his brain were unremarkable - CTA showed no narrowing of the internal carotids or anterior cerebral artery - No echo done.  - patient saw his eye doctor and he was concerned about temporal arteritis and requested crp and esr.  - Lab results reviewed, but C-reactive protein and sedimentation rate were not performed  Neurological and systemic symptoms - No headaches - No temporal pain - No other neurological symptoms  Blood pressure and cardiovascular risk factors - Blood pressure at home ranged from 102 to 140 mmHg - Blood pressure during hospital visit was 150-160 mmHg - Currently on a low dose of cholesterol medication - crestor  5 mg before bed.  - Resumed baby aspirin therapy after bladder stone procedure on January 9th         02/09/2024    9:30 AM 11/09/2023    8:51 AM 08/10/2023    9:10 AM 01/13/2023    7:31 AM 07/14/2022   11:18 AM  Depression screen PHQ 2/9  Decreased Interest 0 0 0 0 0  Down, Depressed, Hopeless 0 0 0 0 0  PHQ - 2 Score 0 0 0 0 0  Altered sleeping  0     Tired, decreased energy  0     Change in appetite  0     Feeling bad or failure about yourself   0     Trouble concentrating  0      Moving slowly or fidgety/restless  0     Suicidal thoughts  0     PHQ-9 Score  0      Difficult doing work/chores  Not difficult at all        Data saved with a previous flowsheet row definition        02/05/2024    3:51 PM  Fall Risk   Falls in the past year? 0   Number falls in past yr: 0  Injury with Fall? 0   Risk for fall due to : No Fall Risks  Follow up Falls prevention discussed;Education provided;Falls evaluation completed     Manually entered by patient   Data saved with a previous flowsheet row definition    Patient Care Team: Sherre Clapper, MD as PCP - General (Family Medicine) Neysa Reggy BIRCH, MD as Consulting Physician (Pulmonary Disease) Raylene Debby MATSU., MD as Referring Physician (Cardiology) Cleotilde Elspeth CROME, OD (Optometry) [facility], [facility]   Review of Systems  Constitutional:  Negative for chills, fatigue and fever.  HENT:  Negative for congestion, ear pain and sore throat.   Eyes:  Negative for visual disturbance.  Respiratory:  Negative for cough and shortness of breath.  Cardiovascular:  Negative for chest pain.  Gastrointestinal:  Negative for abdominal pain, constipation, diarrhea, nausea and vomiting.  Genitourinary:  Negative for dysuria and frequency.  Musculoskeletal:  Negative for arthralgias and myalgias.  Neurological:  Negative for dizziness and headaches.  Psychiatric/Behavioral:  Negative for dysphoric mood.        No dysphoria    Medications Ordered Prior to Encounter[1] Past Medical History:  Diagnosis Date   Benign localized prostatic hyperplasia with lower urinary tract symptoms (LUTS)    Chronic kidney disease    DDD (degenerative disc disease), cervical    Dysrhythmia    PVCs   ED (erectile dysfunction)    Ground glass opacity present on imaging of lung 11/15/2021   History of kidney stones    Ileostomy in place [facility]) 1991   s/p coloprototectomy for UC   Mixed hyperlipidemia    OA (osteoarthritis)    OSA  on CPAP    uses nightly per pt   (followed by dr c young)   Peyronie's disease 2019   completed treatment w/ injections 03/ 2021 by dr ceil   Pneumonia    Pre-diabetes    followed by pcp    (07-28-2022  per pt checks blood sugar 3-4 times daily, stated last A1c  5.6,  takes metformin )   Sleep apnea    UC (ulcerative colitis) (HCC)    s/p  completed colectomy w/ ileostomy 1991   Ulcerative colitis (HCC) 03/15/2008   Annotation: with iliostomy  Qualifier: Diagnosis of   By: Clifford Almarie Aguas urinary stream    Past Surgical History:  Procedure Laterality Date   APPENDECTOMY     CATARACT EXTRACTION W/ INTRAOCULAR LENS IMPLANT Bilateral 2020   COLON SURGERY  1991   Ileostomy   COLOPROCTECTOMY W/ ILEO J POUCH  1991   for UC  (ileostomy RLQ)  w/ appendectomy   CYSTOSCOPY N/A 09/14/2024   Procedure: CYSTOSCOPY;  Surgeon: Selma Donnice SAUNDERS, MD;  Location: [facility];  Service: Urology;  Laterality: N/A;   CYSTOSCOPY WITH LITHOLAPAXY N/A 09/14/2024   Procedure: CYSTOSCOPY, WITH BLADDER CALCULUS LITHOLAPAXY;  Surgeon: Selma Donnice SAUNDERS, MD;  Location: [facility];  Service: Urology;  Laterality: N/A;   EYE SURGERY  2020   Cataract Surgery   INGUINAL HERNIA REPAIR  2002   JOINT REPLACEMENT     KNEE ARTHROSCOPY Left    x3  last one 2009   REPLACEMENT UNICONDYLAR JOINT KNEE Left 09/27/2008   @[facility] [place] by dr raynold   REVISION TOTAL KNEE ARTHROPLASTY Left 09/09/2020   @[facility] [place] by dr shields;   Uni partial TK to TKA   TONSILLECTOMY     age 32   TRANSURETHRAL RESECTION OF PROSTATE N/A 08/02/2022   Procedure: TRANSURETHRAL RESECTION OF THE PROSTATE (TURP), BIPOLAR;  Surgeon: Selma Donnice SAUNDERS, MD;  Location: [facility];  Service: Urology;  Laterality: N/A;    Family History  Problem Relation Age of Onset   Cerebrovascular Accident Mother    Acute myelogenous leukemia Father    Prostate cancer Father    Coronary artery disease Maternal Uncle    Coronary artery  disease Maternal Grandmother    Stroke Paternal Grandmother    Stroke Paternal Grandfather    Social History   Socioeconomic History   Marital status: Married    Spouse name: Not on file   Number of children: 2   Years of education: Not on file   Highest  education level: Master's degree (e.g., MA, MS, MEng, MEd, MSW, MBA)  Occupational History   Not on file  Tobacco Use   Smoking status: Never   Smokeless tobacco: Never  Vaping Use   Vaping status: Never Used  Substance and Sexual Activity   Alcohol use: Yes    Alcohol/week: 2.0 standard drinks of alcohol    Types: 2 Glasses of wine per week    Comment: Have reduced alcohol consumption the past year   Drug use: Never   Sexual activity: Yes  Other Topics Concern   Not on file  Social History Narrative   Not on file   Social Drivers of Health   Tobacco Use: Low Risk (09/22/2024)   Patient History    Smoking Tobacco Use: Never    Smokeless Tobacco Use: Never    Passive Exposure: Not on file  Financial Resource Strain: Low Risk (08/15/2024)   Overall Financial Resource Strain (CARDIA)    Difficulty of Paying Living Expenses: Not hard at all  Food Insecurity: No Food Insecurity (08/15/2024)   Epic    Worried About Programme Researcher, Broadcasting/film/video in the Last Year: Never true    Ran Out of Food in the Last Year: Never true  Transportation Needs: No Transportation Needs (08/15/2024)   Epic    Lack of Transportation (Medical): No    Lack of Transportation (Non-Medical): No  Physical Activity: Insufficiently Active (08/15/2024)   Exercise Vital Sign    Days of Exercise per Week: 5 days    Minutes of Exercise per Session: 20 min  Stress: No Stress Concern Present (08/15/2024)   [employer] of Occupational Health - Occupational Stress Questionnaire    Feeling of Stress: Not at all  Social Connections: Socially Integrated (08/15/2024)   Social Connection and Isolation Panel    Frequency of Communication with Friends and Family:  More than three times a week    Frequency of Social Gatherings with Friends and Family: More than three times a week    Attends Religious Services: More than 4 times per year    Active Member of Clubs or Organizations: Yes    Attends Banker Meetings: More than 4 times per year    Marital Status: Married  Depression (PHQ2-9): Low Risk (02/09/2024)   Depression (PHQ2-9)    PHQ-2 Score: 0  Alcohol Screen: Low Risk (08/15/2024)   Alcohol Screen    Last Alcohol Screening Score (AUDIT): 3  Housing: Low Risk (08/16/2024)   Epic    Unable to Pay for Housing in the Last Year: No    Number of Times Moved in the Last Year: 0    Homeless in the Last Year: No  Utilities: Not At Risk (02/09/2024)   AHC Utilities    Threatened with loss of utilities: No  Health Literacy: Adequate Health Literacy (02/09/2024)   B1300 Health Literacy    Frequency of need for help with medical instructions: Never    Objective:  BP 130/80   Pulse (!) 58   Temp 98 F (36.7 C)   Ht 5' 8 (1.727 m)   Wt 200 lb (90.7 kg)   SpO2 96%   BMI 30.41 kg/m      09/27/2024    9:01 AM 09/23/2024    1:17 AM 09/22/2024    9:22 PM  BP/Weight  Systolic BP 130 157 157  Diastolic BP 80 82 90  Wt. (Lbs) 200    BMI 30.41 kg/m2      Physical  Exam Vitals reviewed.  Constitutional:      Appearance: Normal appearance.  Neck:     Vascular: No carotid bruit.  Cardiovascular:     Rate and Rhythm: Normal rate and regular rhythm.     Heart sounds: Normal heart sounds.  Pulmonary:     Effort: Pulmonary effort is normal.     Breath sounds: Normal breath sounds. No wheezing, rhonchi or rales.  Abdominal:     General: Bowel sounds are normal.     Palpations: Abdomen is soft.     Tenderness: There is no abdominal tenderness.  Musculoskeletal:        General: Normal range of motion.  Neurological:     Mental Status: He is alert and oriented to person, place, and time.     Cranial Nerves: No cranial nerve deficit.   Psychiatric:        Mood and Affect: Mood normal.        Behavior: Behavior normal.     Lab Results  Component Value Date   WBC 8.4 09/22/2024   HGB 16.3 09/22/2024   HCT 48.0 09/22/2024   PLT 189 09/22/2024   GLUCOSE 89 09/22/2024   CHOL 148 02/15/2024   TRIG 90 02/15/2024   HDL 46 02/15/2024   LDLCALC 85 02/15/2024   ALT 15 09/22/2024   AST 27 09/22/2024   NA 138 09/22/2024   K 4.4 09/22/2024   CL 101 09/22/2024   CREATININE 1.20 09/22/2024   BUN 24 (H) 09/22/2024   CO2 26 09/22/2024   TSH 1.120 08/10/2023   INR 0.9 09/22/2024   HGBA1C 5.5 08/16/2024    Results for orders placed or performed during the hospital encounter of 09/22/24  Protime-INR   Collection Time: 09/22/24  6:29 PM  Result Value Ref Range   Prothrombin Time 12.8 11.4 - 15.2 seconds   INR 0.9 0.8 - 1.2  APTT   Collection Time: 09/22/24  6:29 PM  Result Value Ref Range   aPTT 28 24 - 36 seconds  CBC   Collection Time: 09/22/24  6:29 PM  Result Value Ref Range   WBC 8.4 4.0 - 10.5 K/uL   RBC 5.25 4.22 - 5.81 MIL/uL   Hemoglobin 16.3 13.0 - 17.0 g/dL   HCT 51.7 60.9 - 47.9 %   MCV 91.8 80.0 - 100.0 fL   MCH 31.0 26.0 - 34.0 pg   MCHC 33.8 30.0 - 36.0 g/dL   RDW 86.5 88.4 - 84.4 %   Platelets 189 150 - 400 K/uL   nRBC 0.0 0.0 - 0.2 %  Differential   Collection Time: 09/22/24  6:29 PM  Result Value Ref Range   Neutrophils Relative % 75 %   Neutro Abs 6.4 1.7 - 7.7 K/uL   Lymphocytes Relative 13 %   Lymphs Abs 1.1 0.7 - 4.0 K/uL   Monocytes Relative 9 %   Monocytes Absolute 0.7 0.1 - 1.0 K/uL   Eosinophils Relative 1 %   Eosinophils Absolute 0.1 0.0 - 0.5 K/uL   Basophils Relative 1 %   Basophils Absolute 0.1 0.0 - 0.1 K/uL   Immature Granulocytes 1 %   Abs Immature Granulocytes 0.06 0.00 - 0.07 K/uL  Comprehensive metabolic panel   Collection Time: 09/22/24  6:29 PM  Result Value Ref Range   Sodium 137 135 - 145 mmol/L   Potassium 4.5 3.5 - 5.1 mmol/L   Chloride 100 98 - 111  mmol/L   CO2 26 22 - 32 mmol/L  Glucose, Bld 87 70 - 99 mg/dL   BUN 22 8 - 23 mg/dL   Creatinine, Ser 8.84 0.61 - 1.24 mg/dL   Calcium  9.6 8.9 - 10.3 mg/dL   Total Protein 6.9 6.5 - 8.1 g/dL   Albumin 4.2 3.5 - 5.0 g/dL   AST 27 15 - 41 U/L   ALT 15 0 - 44 U/L   Alkaline Phosphatase 83 38 - 126 U/L   Total Bilirubin 0.4 0.0 - 1.2 mg/dL   GFR, Estimated >39 >39 mL/min   Anion gap 11 5 - 15  Ethanol   Collection Time: 09/22/24  6:29 PM  Result Value Ref Range   Alcohol, Ethyl (B) <15 <15 mg/dL  I-stat chem 8, ED   Collection Time: 09/22/24  6:55 PM  Result Value Ref Range   Sodium 138 135 - 145 mmol/L   Potassium 4.4 3.5 - 5.1 mmol/L   Chloride 101 98 - 111 mmol/L   BUN 24 (H) 8 - 23 mg/dL   Creatinine, Ser 8.79 0.61 - 1.24 mg/dL   Glucose, Bld 89 70 - 99 mg/dL   Calcium , Ion 1.23 1.15 - 1.40 mmol/L   TCO2 25 22 - 32 mmol/L   Hemoglobin 16.3 13.0 - 17.0 g/dL   HCT 51.9 60.9 - 47.9 %  .  Assessment & Plan:   Assessment & Plan TIA (transient ischemic attack) Experienced transient vision loss in the left eye. Extensive workup including CT, MRI, and CTA showed no abnormalities. Differential diagnosis includes temporal arteritis and an embolic event but these were not confirmed. Echocardiogram not performed, which could help identify intermittent atrial fibrillation as a potential source of emboli. Discussed the possibility of a TIA or mini-stroke, with no clear source identified. Emphasized the importance of immediate medical attention if symptoms recur, as TPA is effective within four hours of stroke onset. - Ordered echocardiogram to evaluate for intermittent atrial fibrillation. - Continue aspirin therapy. Orders:   ECHOCARDIOGRAM COMPLETE; Future  Essential hypertension, benign Blood pressure readings have been variable, with recent readings ranging from 102 to 140 mmHg. Stressful situations may contribute to variability. - Continue to monitor blood pressure regularly.     Mixed hyperlipidemia LDL cholesterol level is 98 mg/dL. Goal is to reduce LDL to less than 55 mg/dL due to potential cardiovascular risk. Currently on a low dose of cholesterol medication and tolerating it well. - Increased crestor  to 10 mg to achieve LDL goal of less than 55 mg/dL. Patient has enough at home.      Body mass index is 30.41 kg/m.    No orders of the defined types were placed in this encounter.   Orders Placed This Encounter  Procedures   ECHOCARDIOGRAM COMPLETE     I,Marla I Leal-Borjas,acting as a scribe for Abigail Free, MD.,have documented all relevant documentation on the behalf of Abigail Free, MD,as directed by  Abigail Free, MD while in the presence of Abigail Free, MD.   Follow-up: previous scheduled follow up   An After Visit Summary was printed and given to the patient.  I attest that I have reviewed this visit and agree with the plan scribed by my staff.   Abigail Free, MD [facility] [phone]       [1]  Current Outpatient Medications on File Prior to Visit  Medication Sig Dispense Refill   aspirin 81 MG EC tablet Take 81 mg by mouth every other day. In the evening.     carboxymethylcellulose (LUBRICANT EYE DROPS)  0.5 % SOLN Place 1-2 drops into both eyes 3 (three) times daily as needed (dry/irritated eyes.).     Coenzyme Q10 (CO Q-10 PO) Take 1 capsule by mouth every other day. In the evening.     finasteride  (PROSCAR ) 5 MG tablet TAKE 1 TABLET BY MOUTH ONCE DAILY (Patient taking differently: Take 5 mg by mouth at bedtime.) 90 tablet 1   metFORMIN  (GLUCOPHAGE -XR) 500 MG 24 hr tablet TAKE 1 TABLET BY MOUTH TWICE A DAY 180 tablet 1   Omega-3 Fatty Acids (FISH OIL) 1000 MG CAPS Take 1 capsule by mouth every other day. In the evening.     rosuvastatin  (CRESTOR ) 5 MG tablet Take 5 mg by mouth every evening.     No current facility-administered medications on file prior to visit.   "

## 2024-09-27 NOTE — Patient Instructions (Signed)
" °  VISIT SUMMARY: During your visit, we discussed your recent episode of transient vision loss in your right eye and reviewed your diagnostic evaluations. We also addressed your cholesterol levels and blood pressure management.  YOUR PLAN: TRANSIENT ISCHEMIC ATTACK: You experienced a brief episode of vision loss in your right eye, which may have been a mini-stroke or TIA. Extensive tests showed no clear source. -We have ordered an echocardiogram to check for intermittent atrial fibrillation. -Continue taking your aspirin therapy. -Seek immediate medical attention if you experience any recurrence of symptoms, as treatment is most effective within four hours of onset.  HYPERLIPIDEMIA: Your LDL cholesterol level is 98 mg/dL, and our goal is to reduce it to less than 55 mg/dL to lower your cardiovascular risk. -We have increased your crestor  to 10 mg to help achieve this goal.  "

## 2024-09-29 ENCOUNTER — Other Ambulatory Visit: Payer: Self-pay | Admitting: Family Medicine

## 2024-10-01 ENCOUNTER — Encounter: Payer: Self-pay | Admitting: Family Medicine

## 2024-10-01 MED ORDER — ROSUVASTATIN CALCIUM 5 MG PO TABS: 10.0000 mg | ORAL_TABLET | Freq: Every evening | ORAL | Status: AC

## 2024-10-02 ENCOUNTER — Encounter: Payer: Self-pay | Admitting: Family Medicine

## 2024-10-09 ENCOUNTER — Ambulatory Visit: Payer: Self-pay | Admitting: Family Medicine

## 2025-02-14 ENCOUNTER — Ambulatory Visit: Admitting: Family Medicine

## 2025-02-14 ENCOUNTER — Ambulatory Visit
